# Patient Record
Sex: Female | Born: 1960 | Race: Black or African American | Hispanic: No | Marital: Married | Smoking: Former smoker
Health system: Southern US, Community
[De-identification: ages and names within clinical notes are randomized; demographics above are authoritative.]

## PROBLEM LIST (undated history)

## (undated) DIAGNOSIS — Z789 Other specified health status: Secondary | ICD-10-CM

## (undated) DIAGNOSIS — R112 Nausea with vomiting, unspecified: Secondary | ICD-10-CM

## (undated) DIAGNOSIS — Z9889 Other specified postprocedural states: Secondary | ICD-10-CM

## (undated) HISTORY — PX: ABDOMINAL HYSTERECTOMY: SHX81

## (undated) HISTORY — PX: SHOULDER OPEN ROTATOR CUFF REPAIR: SHX2407

## (undated) HISTORY — PX: EYE SURGERY: SHX253

---

## 1998-09-30 ENCOUNTER — Observation Stay (HOSPITAL_COMMUNITY): Admission: AD | Admit: 1998-09-30 | Discharge: 1998-10-01 | Payer: Self-pay | Admitting: Obstetrics and Gynecology

## 1998-09-30 ENCOUNTER — Encounter (INDEPENDENT_AMBULATORY_CARE_PROVIDER_SITE_OTHER): Payer: Self-pay | Admitting: Specialist

## 1999-05-10 ENCOUNTER — Other Ambulatory Visit: Admission: RE | Admit: 1999-05-10 | Discharge: 1999-05-10 | Payer: Self-pay | Admitting: Obstetrics and Gynecology

## 1999-11-29 ENCOUNTER — Inpatient Hospital Stay (HOSPITAL_COMMUNITY): Admission: AD | Admit: 1999-11-29 | Discharge: 1999-12-01 | Payer: Self-pay | Admitting: *Deleted

## 2000-01-06 ENCOUNTER — Other Ambulatory Visit: Admission: RE | Admit: 2000-01-06 | Discharge: 2000-01-06 | Payer: Self-pay | Admitting: Obstetrics and Gynecology

## 2000-01-24 ENCOUNTER — Encounter: Payer: Self-pay | Admitting: Obstetrics and Gynecology

## 2000-01-24 ENCOUNTER — Encounter: Admission: RE | Admit: 2000-01-24 | Discharge: 2000-01-24 | Payer: Self-pay | Admitting: Obstetrics and Gynecology

## 2000-11-01 ENCOUNTER — Encounter (INDEPENDENT_AMBULATORY_CARE_PROVIDER_SITE_OTHER): Payer: Self-pay | Admitting: Specialist

## 2000-11-01 ENCOUNTER — Observation Stay (HOSPITAL_COMMUNITY): Admission: RE | Admit: 2000-11-01 | Discharge: 2000-11-02 | Payer: Self-pay | Admitting: Orthopedic Surgery

## 2001-07-06 ENCOUNTER — Other Ambulatory Visit: Admission: RE | Admit: 2001-07-06 | Discharge: 2001-07-06 | Payer: Self-pay | Admitting: Obstetrics and Gynecology

## 2001-09-17 ENCOUNTER — Ambulatory Visit (HOSPITAL_COMMUNITY): Admission: RE | Admit: 2001-09-17 | Discharge: 2001-09-17 | Payer: Self-pay | Admitting: Obstetrics and Gynecology

## 2002-04-04 HISTORY — PX: CARPAL TUNNEL RELEASE: SHX101

## 2002-11-14 ENCOUNTER — Other Ambulatory Visit: Admission: RE | Admit: 2002-11-14 | Discharge: 2002-11-14 | Payer: Self-pay | Admitting: Obstetrics and Gynecology

## 2003-06-03 ENCOUNTER — Ambulatory Visit (HOSPITAL_COMMUNITY): Admission: RE | Admit: 2003-06-03 | Discharge: 2003-06-03 | Payer: Self-pay | Admitting: Obstetrics and Gynecology

## 2003-07-22 ENCOUNTER — Observation Stay (HOSPITAL_COMMUNITY): Admission: RE | Admit: 2003-07-22 | Discharge: 2003-07-23 | Payer: Self-pay | Admitting: Obstetrics and Gynecology

## 2003-07-22 ENCOUNTER — Encounter (INDEPENDENT_AMBULATORY_CARE_PROVIDER_SITE_OTHER): Payer: Self-pay | Admitting: Specialist

## 2004-02-19 ENCOUNTER — Emergency Department (HOSPITAL_COMMUNITY): Admission: EM | Admit: 2004-02-19 | Discharge: 2004-02-19 | Payer: Self-pay | Admitting: Emergency Medicine

## 2004-02-23 ENCOUNTER — Ambulatory Visit (HOSPITAL_COMMUNITY): Admission: RE | Admit: 2004-02-23 | Discharge: 2004-02-23 | Payer: Self-pay | Admitting: Family Medicine

## 2004-07-30 ENCOUNTER — Other Ambulatory Visit: Admission: RE | Admit: 2004-07-30 | Discharge: 2004-07-30 | Payer: Self-pay | Admitting: Obstetrics and Gynecology

## 2004-09-12 ENCOUNTER — Emergency Department (HOSPITAL_COMMUNITY): Admission: EM | Admit: 2004-09-12 | Discharge: 2004-09-12 | Payer: Self-pay | Admitting: *Deleted

## 2004-09-14 ENCOUNTER — Ambulatory Visit (HOSPITAL_COMMUNITY): Admission: RE | Admit: 2004-09-14 | Discharge: 2004-09-14 | Payer: Self-pay | Admitting: Family Medicine

## 2004-09-21 ENCOUNTER — Ambulatory Visit (HOSPITAL_COMMUNITY): Admission: RE | Admit: 2004-09-21 | Discharge: 2004-09-21 | Payer: Self-pay | Admitting: Family Medicine

## 2005-07-06 ENCOUNTER — Other Ambulatory Visit: Admission: RE | Admit: 2005-07-06 | Discharge: 2005-07-06 | Payer: Self-pay | Admitting: Family Medicine

## 2008-02-13 ENCOUNTER — Emergency Department (HOSPITAL_COMMUNITY): Admission: EM | Admit: 2008-02-13 | Discharge: 2008-02-13 | Payer: Self-pay | Admitting: Emergency Medicine
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
## 2010-08-20 NOTE — H&P (Signed)
[facility]  Patient:    [name], [name] Visit Number: [id number] MRN: [id number]          Service Type: DSU Location: [facility] Attending Physician:  Frederich Balding Dictated by:   Juluis Mire, M.D. Admit Date:  09/17/2001                           History and Physical  CHIEF COMPLAINT:              The patient is a 50 year old gravida 5 para 2 abortus 3 married black female who presents for permanent sterilization.  PRESENT ADMISSION:            The patient is desirous of permanent sterilization.  Alternative forms of birth control have been discussed; the potential irreversibility of sterilization explained.  A failure rate of 1 in 200 is quoted.  Failures can be in the form of ectopic pregnancy requiring further surgical management.  The patient understanding of the indications and risk and options and wishes to proceed with permanent sterilization.  ALLERGIES:                    No known drug allergies.  MEDICATIONS:                  None.  PAST MEDICAL HISTORY:         Usual childhood diseases without any significant sequelae.  PAST SURGICAL AND OBSTETRICAL HISTORY:          The patient had a previous laparoscopy in 1993 with finding of a chronic ectopic pregnancy.  Subsequently, did conceive and carry to a spontaneous vaginal delivery.  In the year 2000 had a subsequent recurrent ectopic pregnancy treated with laparoscopy and linear salpingostomy.  She recently had a spontaneous abortion earlier this year.  She has had two spontaneous vaginal deliveries in addition to those listed above, and one TAB.  FAMILY HISTORY:               Noncontributory.  SOCIAL HISTORY:               No tobacco or alcohol use.  REVIEW OF SYSTEMS:            Noncontributory.  PHYSICAL EXAMINATION:  VITAL SIGNS:                  The patient is afebrile with stable vital signs.  HEENT:                        The patient is normocephalic.  Pupils  equal, round, and reactive to light and accommodation.  Extraocular movements were intact.  Sclerae and conjunctivae were clear, oropharynx clear.  NECK:                         Without thyromegaly.  BREASTS:                      No discrete masses.  LUNGS:                        Clear.  CARDIOVASCULAR:               Regular rhythm and rate without murmurs or gallops.  ABDOMEN:  Benign.  No masses, organomegaly, or tenderness.  PELVIC:                       Normal external genitalia, vaginal mucosa is clear.  Cervix unremarkable.  Uterus normal size, shape, and contour.  Adnexa free of masses or tenderness.  EXTREMITIES:                  Trace edema.  NEUROLOGIC:                   Grossly within normal limits.  IMPRESSION:                   Multiparity, desires sterility.  PLAN:                         The patient to undergo a laparoscopic bilateral tubal ligation.  The risks of surgery have been discussed, including the risks of anesthesia; the risk of infection; the risk of hemorrhage that could necessitate transfusion with the risk of AIDS or hepatitis; the risk of injury to other organs including bladder, bowel, or ureters that could require further exploratory surgery; the risk of deep venous thrombosis and pulmonary emboli.  The patient professed an understanding of the indications and risks. Dictated by:   Juluis Mire, M.D. Attending Physician:  Frederich Balding DD:  09/17/01 TD:  09/17/01 Job: [id number]

## 2010-08-20 NOTE — Op Note (Signed)
NAMECAITLINN, [name]                          ACCOUNT NO.:  [id number]   MEDICAL RECORD NO.:  [id number]                   PATIENT TYPE:  OBV   LOCATION:  9309                                 FACILITY:  [facility]   PHYSICIAN:  Juluis Mire, M.D.                DATE OF BIRTH:  05/10/1960   DATE OF PROCEDURE:  07/23/2003  DATE OF DISCHARGE:  07/23/2003                                 OPERATIVE REPORT   PREOPERATIVE DIAGNOSES:  1. Uterine adenomyosis.  2. Pelvic adhesions.   POSTOPERATIVE DIAGNOSES:  1. Uterine adenomyosis.  2. Pelvic adhesions.   PROCEDURE:  Open laparoscopy.  Laparoscopically assisted vaginal  hysterectomy.   SURGEON:  Juluis Mire, M.D.   ASSISTANT:  Guy Sandifer. [name], M.D.   ANESTHESIA:  General endotracheal anesthesia.   ESTIMATED BLOOD LOSS:  300 mL.   PACKS AND DRAINS:  None.   INTRAOPERATIVE BLOOD PLACED:  None.   COMPLICATIONS:  None.   INDICATIONS FOR PROCEDURE:  As noted in the history and physical.   DESCRIPTION OF PROCEDURE:  The patient was taken to the OR and placed in the  supine position.  After satisfactory level of general endotracheal  anesthesia was obtained, the patient was placed in the dorsal lithotomy  position using the Allen stirrups.  The abdomen, perineum and vagina were  prepped out with Betadine.  Bladder was emptied by in and out  catheterization.  A Hulka tenaculum was put in place.  The patient draped  out for surgery.  A subumbilical incision was made with the knife.  Incision  was extended through subcutaneous tissue.  The fascia was identified and  entered sharply.  Incision in fascia extended laterally.  Peritoneum was  then entered.  There was no evidence of injury to adjacent organs.  Open  laparoscopic trocar was put into place and secured.  Laparoscope was then  introduced.  There was no evidence of injury to adjacent organs.  Visualization revealed some slight omental adhesions to the umbilical area.  No bowel  was noted.  The cecum was noted to be normal.  Appendix appeared to  be retrocecal.  Upper abdomen including the liver and tip of the gallbladder  were clear.  A 5 mm trocar was put into place in the suprapubic area under  direct visualization.  The uterus was markedly enlarged.  She had adhesions  on the right side from tubes and ovaries. Left adnexa was basically  unremarkable.  She had previous evidence of bilateral tubal ligation.  There  were some cul-de-sac adhesions also.  No active process such as  endometriosis were noted.  A third 5 mm trocar was put in place in the left  lower quadrant. Uterus was then elevated.  We first went to the right side.  We took down some adhesions involving the right ovary.  Also in the cul-de-  sac area.  At this point  in time, we were able to identify the adnexa  better.  We could easily see the ureter passed along the pelvic sidewall as  well out of the operative field.  We first identified the utero-ovarian  pedicle using the Gyrus bipolar, we cauterized and incised this along with  the peritoneum between the utero-ovarian pedicle and the round ligament.  Then the round ligament was cauterized and incised.  We further took down  some of the broad ligament.  We had good hemostasis.  We then went to the  left side where the left utero-ovarian pedicle was isolated, cauterized and  incised.  The peritoneum between the utero-ovarian pedicle and round  ligament was cauterized and incised and the left round ligament was  cauterized and incised.  The left broad ligament was taken down.  We then  developed a bladder flap anteriorly.  At this point in time, we had good  hemostasis.  The abdomen was deflated of its carbon dioxide and laparoscope  was removed.   The patient's legs were repositioned.  The Hulka tenaculum was then removed.  The weighted speculum was placed in the vaginal vault.  The cervix was  grasped with Christella Hartigan tenaculum.  Cul-de-sac was  entered sharply. Both  uterosacral ligaments were clamped, cut, and suture ligated with 0 Vicryl.  The reflection of the vaginal mucosa anteriorly was incised.  Paracervical  tissue was clamped, cut, and suture ligated with 0 Vicryl.  Bladder was  further dissected superiorly.  Uterine vessels were clamped, cut, and suture  ligated with 0 Vicryl.  Vesicouterine space was then entered, retractor was  put in place to retract the bladder superiorly using the clamp, cut and tie  with suture ligature layer of 0 Vicryl.  The parametrium was serially  separated from the sides of the uterus.  We got to the point where the  uterus would not come down anymore and yet not flip.  We therefore began  morcellating the uterus.  We first cut off the cervix and lower uterine  segment.  Then, part of the uterus proper was removed in sections.  At this  point in time, we were able to flip the uterus which we did successfully.  We identified the remaining pedicles, clamped and cut those and the  remaining part of the uterus removed from the operative field.  Head  pedicles were secured with three ties of 0 Vicryl.  Bleeding was countered  on both pelvic sidewalls at this point in time, identified, isolated and  brought under control with two figure-of-eights of 0 Vicryl.  We had good  hemostasis at this point.  Vaginal mucosa was reapproximated in a vertical  fashion with interrupted figure-of-eights with 0 Vicryl.  Foley was placed  to straight drain which revealed clear urine. Sponge on sponge stick was  placed in the vaginal vault.   The patient's legs were repositioned.  Abdomen was reinflated with carbon  dioxide.  Laparoscope was reintroduced.  Minimal bleeding was noted at the  top of the top of the vaginal cuff, brought under control using the  NovaSure.  We thoroughly irrigated the pelvis.  We had excellent hemostasis in both the ovaries and vaginal cuff.  The abdomen was deflated of its  carbon  dioxide.  All trocars removed.  Subumbilical fascia was closed with  two figure-of-eights of 0 Vicryl.  Skin was closed with interrupted  subcuticulars of 4-0 Vicryl.  Suprapubic incision was closed with Dermabond.  Sponge on sponge stick  was removed from the vaginal vault.  The patient was  taken out of the dorsal lithotomy position, once alert and extubated,  transferred to the recovery room in good condition.  Sponge, needle and  instrument counts were reported as correct by the circulating nurse x2.                                               Juluis Mire, M.D.    JSM/MEDQ  D:  07/23/2003  T:  07/24/2003  Job:  [id number]

## 2010-08-20 NOTE — Discharge Summary (Signed)
[name], [name]                          ACCOUNT NO.:  [id number]   MEDICAL RECORD NO.:  [id number]                   PATIENT TYPE:  OBV   LOCATION:  9309                                 FACILITY:  [facility]   PHYSICIAN:  Juluis Mire, M.D.                DATE OF BIRTH:  June 03, 1960   DATE OF ADMISSION:  07/22/2003  DATE OF DISCHARGE:  07/23/2003                                 DISCHARGE SUMMARY   ADMITTING DIAGNOSIS:  Uterine adenomyosis with pelvic adhesions.   DISCHARGE DIAGNOSIS:  Uterine adenomyosis with pelvic adhesions.   OPERATIVE PROCEDURE:  Laparoscopic-assisted vaginal hysterectomy.   For complete history and physical please see dictated note.   COURSE IN THE HOSPITAL:  The patient underwent the above-noted surgery,  pathology pending.  Postoperatively did well.  Postoperative hemoglobin 9.8.  Discharged home on postoperative day #1.  At that time she was afebrile with  stable vital signs.  Abdomen was soft and nontender, bowel sounds are  active, incision is clear, voiding okay, no active bleeding.   COMPLICATIONS:  None encountered during stay in the hospital.  The patient  discharged home in stable condition.   DISPOSITION:  Routine postoperative instruction are given.  She is to avoid  heavy lifting, vaginal entrance, or driving a car.  She is to watch for  signs of infection, nausea and vomiting, increasingly abdominal pain, or  active vaginal bleeding.  Discharged home on Tylox as needed for pain.  Plan  to follow up in the office in 1 week.                                               Juluis Mire, M.D.    JSM/MEDQ  D:  07/23/2003  T:  07/24/2003  Job:  [id number]

## 2010-08-20 NOTE — H&P (Signed)
NAMELASANDRA, [name]                          ACCOUNT NO.:  [id number]   MEDICAL RECORD NO.:  [id number]                   PATIENT TYPE:  OBV   LOCATION:  9309                                 FACILITY:  [facility]   PHYSICIAN:  Juluis Mire, M.D.                DATE OF BIRTH:  1960/11/21   DATE OF ADMISSION:  07/22/2003  DATE OF DISCHARGE:                                HISTORY & PHYSICAL   CHIEF COMPLAINT:  The patient is a 50 year old gravida 5 para 3 abortus 2  black female who presents for a laparoscopic-assisted vaginal hysterectomy.   PRESENT ADMISSION:  We have been following the patient with thought to be  felt to be uterine fibroids.  Cycles have remained relatively regular.  However, menstrual flow has become extremely heavy and limiting.  She is  also having increasing discomfort associated with her cycles.  We had felt  that the uterus had been enlarging.  We subsequently did obtain an  ultrasound which just revealed gross uterine enlargement, probably  consistent with adenomyosis.  Saline infusion ultrasound was otherwise  unremarkable.  She has had a previous bilateral tubal ligation.  We  attempted to control the cycles with birth control pills and were  unsuccessful.  We had discussed the options of endometrial ablation versus  hysterectomy and the patient is admitted for the latter procedure.  Again,  this is management for uterine enlargement thought to be secondary to  adenomyosis with increasing difficulty with her menstrual cycles.   ALLERGIES:  No known drug allergies.   MEDICATIONS:  Include vitamins and iron at the present time.   PAST MEDICAL HISTORY:  She has had usual childhood diseases with no  significant sequelae.   PAST SURGICAL HISTORY:  In 1993 she had what was thought to be a chronic  ectopic pregnancy.  This was laparoscopically managed and removed.  Subsequently, in 2000 she conceived and carried to a spontaneous vaginal  delivery.  In the year  2000 she had a recurrent ectopic pregnancy that was  treated laparoscopically with linear salpingostomy.  She has had one other  vaginal delivery since that time and has had one TAB.   FAMILY HISTORY:  Noncontributory.   SOCIAL HISTORY:  No tobacco or alcohol use.   REVIEW OF SYSTEMS:  Noncontributory.   PHYSICAL EXAMINATION:  GENERAL:  The patient is afebrile with stable vital  signs.  HEENT:  The patient is normocephalic.  Pupils equal, round, and reactive to  light and accommodation.  Extraocular movements were intact.  Sclerae and  conjunctivae were clear, oropharynx clear.  NECK:  Without thyromegaly.  BREASTS:  No discrete masses.  LUNGS:  Clear.  CARDIOVASCULAR:  Regular rhythm and rate without murmurs or gallops.  ABDOMEN:  Benign.  No masses, organomegaly, or tenderness.  Does have a  subumbilical incision.  PELVIC:  Normal external genitalia, vaginal mucosa is clear.  Cervix  unremarkable.  Uterus is grossly enlarged.  Mildly tender and boggy,  probable adenomyosis.  Adnexa unremarkable.  EXTREMITIES:  Trace edema.  NEUROLOGIC:  Grossly within normal limits.   IMPRESSION:  1. Probable uterine adenomyosis leading to increasing menorrhagia and     dysmenorrhea.  2. History of ectopic pregnancies.  3. Known pelvic adhesions.   PLAN:  The patient to undergo laparoscopic evaluation with attempt at  laparoscopic-assisted vaginal hysterectomy.  The risks of surgery have been  discussed including the risk of infection; the risk of hemorrhage that could  necessitate transfusion with the risk of AIDS or hepatitis; the risk of  injury to adjacent organs including bladder, bowel, or ureters that could  require further exploratory surgery; the risk of deep venous thrombosis and  pulmonary embolus.  The patient professed an understanding of the  indications and risks and is accepting of them.                                               Juluis Mire, M.D.    JSM/MEDQ  D:   07/22/2003  T:  07/22/2003  Job:  [id number]

## 2010-08-20 NOTE — Op Note (Signed)
[facility]  Patient:    [name], [name]                       MRN: [id number] Proc. Date: 11/01/00 Adm. Date:  [id number] Attending:  Skip Mayer                           Operative Report  PREOPERATIVE DIAGNOSES: 1. Lipoma, right shoulder. 2. Severe impingement syndrome, right shoulder. 3. Carpal tunnel syndrome, right wrist.  POSTOPERATIVE DIAGNOSES: 1. Lipoma, right shoulder. 2. Severe impingement syndrome, right shoulder. 3. Carpal tunnel syndrome, right wrist.  OPERATION: 1. Excision lipoma, right shoulder. 2. Open decompression of the right shoulder by carrying out a partial    acromionectomy and acromioplasty. 3. Excision of the subdeltoid bursa, right shoulder. 4. Exploration of the rotator cuff, right shoulder, which was intact. 5. Right carpal tunnel release.  SURGEON:  Georges Lynch. Gioffre, M.D.  ASSISTANT:  Alexzandrew L. Perkins, P.A.-C. (for shoulder surgery only)  DESCRIPTION OF PROCEDURE:  Under general anesthesia, routine orthopedic prepping and draping of the right shoulder was carried out.  Incision was made over the anterior aspect of the right shoulder.  She had 1 g of IV Ancef.  At this time, she had a large lipoma of the anterior aspect of the right shoulder, and I excised that and sent it to the lab as biopsy.  Following this, I went down and separated the deltoid tendon from the acromion.  I split the deltoid muscle proximally.  I went down and noted that she had an extremely large and thickened subdeltoid bursa.  She had severe impingement syndrome.  Her acromion was literally embedded down in the rotator cuff.  I first utilized the Ship broker to protect the rotator cuff.  I then did a partial excision of the acromion and then utilized the bur to even the undersurface out.  Following this, I excised the subdeltoid bursa, went down and explored the rotator cuff.  It was intact.  I thoroughly  irrigated out the area.  I went back and looked at the rotator cuff again, and it was intact.  We now had a nice clearance for her in regards to abduction of her shoulder after we did the acromionectomy.  I then applied some antibiotic-soaked Gelfoam up under the acromion for hemostasis purposes.  I then closed the wound in layers in usual fashion after I reapproximated the deltoid tendon and muscle.  Following this, a sterile dressing was applied.  We then redraped and reprepped for the right wrist.  For the above procedures, Avel Peace, P.A. was the assistant.  He did not assist on the carpal tunnel. I then reprepped the hand and wrist in the usual fashion.  I exsanguinated the extremity with Esmarch.  Tourniquet was elevated at 250 mmHg.  A lazy S incision was made over the right carpal tunnel region, bleeders identified and cauterized.  I went down and identified the palmaris longus and retracted it. I then went down and incised the superficial and deep transverse carpal ligaments and exposed the median nerve.  We now freed the nerve up proximally and distally.  I thoroughly irrigated the area and closed the skin only with 3-0 nylon suture.  A sterile Neosporin bundle dressing applied.  The patient left the operating room in satisfactory condition. DD:  11/01/00 TD:  11/01/00 Job: [id number]

## 2010-11-22 ENCOUNTER — Other Ambulatory Visit (HOSPITAL_COMMUNITY)
Admission: RE | Admit: 2010-11-22 | Discharge: 2010-11-22 | Disposition: A | Discharge status: Home / Self Care | Payer: BC Managed Care – PPO | Source: Ambulatory Visit | Attending: Family Medicine | Admitting: Family Medicine

## 2010-11-22 ENCOUNTER — Other Ambulatory Visit: Payer: Self-pay | Admitting: Family Medicine

## 2010-11-22 DIAGNOSIS — Z0000 Encounter for general adult medical examination without abnormal findings: Secondary | ICD-10-CM | POA: Insufficient documentation

## 2011-05-30 ENCOUNTER — Other Ambulatory Visit: Payer: Self-pay | Admitting: Orthopedic Surgery

## 2011-05-30 DIAGNOSIS — M542 Cervicalgia: Secondary | ICD-10-CM

## 2011-06-03 ENCOUNTER — Ambulatory Visit
Admission: RE | Admit: 2011-06-03 | Discharge: 2011-06-03 | Disposition: A | Discharge status: Home / Self Care | Payer: BC Managed Care – PPO | Source: Ambulatory Visit | Attending: Orthopedic Surgery | Admitting: Orthopedic Surgery

## 2011-06-03 DIAGNOSIS — M542 Cervicalgia: Secondary | ICD-10-CM

## 2011-07-25 ENCOUNTER — Encounter (HOSPITAL_COMMUNITY): Payer: Self-pay | Admitting: Respiratory Therapy

## 2011-07-25 ENCOUNTER — Other Ambulatory Visit: Payer: Self-pay | Admitting: Orthopedic Surgery

## 2011-07-26 ENCOUNTER — Other Ambulatory Visit: Payer: Self-pay | Admitting: Orthopedic Surgery

## 2011-07-29 ENCOUNTER — Encounter (HOSPITAL_COMMUNITY)
Admission: RE | Admit: 2011-07-29 | Discharge: 2011-07-29 | Disposition: A | Discharge status: Home / Self Care | Payer: BC Managed Care – PPO | Source: Ambulatory Visit | Attending: Orthopedic Surgery | Admitting: Orthopedic Surgery

## 2011-07-29 ENCOUNTER — Other Ambulatory Visit (HOSPITAL_COMMUNITY): Payer: BC Managed Care – PPO

## 2011-07-29 ENCOUNTER — Encounter (HOSPITAL_COMMUNITY): Payer: Self-pay

## 2011-07-29 HISTORY — DX: Other specified health status: Z78.9

## 2011-07-29 HISTORY — DX: Other specified postprocedural states: Z98.890

## 2011-07-29 HISTORY — DX: Nausea with vomiting, unspecified: R11.2

## 2011-07-29 LAB — CBC
HCT: 37.8 % (ref 36.0–46.0)
MCV: 82.5 fL (ref 78.0–100.0)
RBC: 4.58 MIL/uL (ref 3.87–5.11)
WBC: 3.6 10*3/uL — ABNORMAL LOW (ref 4.0–10.5)

## 2011-07-29 LAB — DIFFERENTIAL
Basophils Absolute: 0 10*3/uL (ref 0.0–0.1)
Basophils Relative: 1 % (ref 0–1)
Monocytes Absolute: 0.2 10*3/uL (ref 0.1–1.0)
Neutro Abs: 1.5 10*3/uL — ABNORMAL LOW (ref 1.7–7.7)
Neutrophils Relative %: 40 % — ABNORMAL LOW (ref 43–77)

## 2011-07-29 LAB — PROTIME-INR: INR: 1.01 (ref 0.00–1.49)

## 2011-07-29 LAB — URINALYSIS, ROUTINE W REFLEX MICROSCOPIC
Glucose, UA: NEGATIVE mg/dL
Hgb urine dipstick: NEGATIVE
Protein, ur: NEGATIVE mg/dL
pH: 6.5 (ref 5.0–8.0)

## 2011-07-29 LAB — ABO/RH: ABO/RH(D): A POS

## 2011-07-29 LAB — COMPREHENSIVE METABOLIC PANEL
BUN: 14 mg/dL (ref 6–23)
CO2: 29 mEq/L (ref 19–32)
Chloride: 106 mEq/L (ref 96–112)
Creatinine, Ser: 0.92 mg/dL (ref 0.50–1.10)
GFR calc non Af Amer: 70 mL/min — ABNORMAL LOW (ref >90)
Total Bilirubin: 0.5 mg/dL (ref 0.3–1.2)

## 2011-07-29 LAB — TYPE AND SCREEN: ABO/RH(D): A POS

## 2011-07-29 LAB — SURGICAL PCR SCREEN: MRSA, PCR: NEGATIVE

## 2011-07-29 NOTE — Pre-Procedure Instructions (Signed)
20 [name]  07/29/2011   Your procedure is scheduled on:  Wednesday, May 1  Report to [facility] at 0630 AM.  Call this number if you have problems the morning of surgery: [phone]   Remember:   Do not eat food:After Midnight.  May have clear liquids: up to 4 Hours before arrival.(2:30 am)  Clear liquids include soda, tea, black coffee, apple or grape juice, broth.  Take these medicines the morning of surgery with A SIP OF WATER: *Tramadol if needed**   Do not wear jewelry, make-up or nail polish.  Do not wear lotions, powders, or perfumes. You may wear deodorant.  Do not shave 48 hours prior to surgery.  Do not bring valuables to the hospital.  Contacts, dentures or bridgework may not be worn into surgery.  Leave suitcase in the car. After surgery it may be brought to your room.  For patients admitted to the hospital, checkout time is 11:00 AM the day of discharge.   Patients discharged the day of surgery will not be allowed to drive home.  Name and phone number of your driver: *n/a**  Special Instructions: CHG Shower Use Special Wash: 1/2 bottle night before surgery and 1/2 bottle morning of surgery.   Please read over the following fact sheets that you were given: Pain Booklet, Coughing and Deep Breathing, MRSA Information and Surgical Site Infection Prevention

## 2011-08-02 MED ORDER — CEFAZOLIN SODIUM-DEXTROSE 2-3 GM-% IV SOLR
2.0000 g | INTRAVENOUS | Status: AC
  Administered 2011-08-03: 2 g via INTRAVENOUS
  Filled 2011-08-02: qty 50

## 2011-08-03 ENCOUNTER — Inpatient Hospital Stay (HOSPITAL_COMMUNITY): Payer: BC Managed Care – PPO

## 2011-08-03 ENCOUNTER — Encounter (HOSPITAL_COMMUNITY): Payer: Self-pay

## 2011-08-03 ENCOUNTER — Encounter (HOSPITAL_COMMUNITY): Payer: Self-pay | Admitting: Anesthesiology

## 2011-08-03 ENCOUNTER — Ambulatory Visit (HOSPITAL_COMMUNITY): Payer: BC Managed Care – PPO | Admitting: Anesthesiology

## 2011-08-03 ENCOUNTER — Encounter (HOSPITAL_COMMUNITY)
Admission: RE | Disposition: A | Discharge status: Home / Self Care | Payer: Self-pay | Source: Ambulatory Visit | Attending: Orthopedic Surgery

## 2011-08-03 ENCOUNTER — Inpatient Hospital Stay (HOSPITAL_COMMUNITY)
Admission: RE | Admit: 2011-08-03 | Discharge: 2011-08-04 | DRG: 865 | Disposition: A | Discharge status: Home / Self Care | Payer: BC Managed Care – PPO | Source: Ambulatory Visit | Attending: Orthopedic Surgery | Admitting: Orthopedic Surgery

## 2011-08-03 DIAGNOSIS — M51379 Other intervertebral disc degeneration, lumbosacral region without mention of lumbar back pain or lower extremity pain: Principal | ICD-10-CM | POA: Diagnosis present

## 2011-08-03 DIAGNOSIS — Z01818 Encounter for other preprocedural examination: Secondary | ICD-10-CM

## 2011-08-03 DIAGNOSIS — M5137 Other intervertebral disc degeneration, lumbosacral region: Principal | ICD-10-CM | POA: Diagnosis present

## 2011-08-03 DIAGNOSIS — M5412 Radiculopathy, cervical region: Secondary | ICD-10-CM

## 2011-08-03 DIAGNOSIS — Z01812 Encounter for preprocedural laboratory examination: Secondary | ICD-10-CM

## 2011-08-03 HISTORY — PX: ANTERIOR CERVICAL DECOMP/DISCECTOMY FUSION: SHX1161

## 2011-08-03 SURGERY — ANTERIOR CERVICAL DECOMPRESSION/DISCECTOMY FUSION 1 LEVEL
Anesthesia: General | Site: Spine Cervical | Laterality: Right | Wound class: Clean

## 2011-08-03 MED ORDER — PROPOFOL 10 MG/ML IV EMUL
INTRAVENOUS | Status: DC | PRN
  Administered 2011-08-03: 30 mg via INTRAVENOUS
  Administered 2011-08-03: 120 mg via INTRAVENOUS

## 2011-08-03 MED ORDER — OXYCODONE-ACETAMINOPHEN 5-325 MG PO TABS
1.0000 | ORAL_TABLET | ORAL | Status: DC | PRN
  Administered 2011-08-03 – 2011-08-04 (×3): 2 via ORAL
  Filled 2011-08-03 (×3): qty 2

## 2011-08-03 MED ORDER — MENTHOL 3 MG MT LOZG
1.0000 | LOZENGE | OROMUCOSAL | Status: DC | PRN
  Filled 2011-08-03: qty 9

## 2011-08-03 MED ORDER — ONDANSETRON HCL 4 MG/2ML IJ SOLN
INTRAMUSCULAR | Status: DC | PRN
  Administered 2011-08-03: 4 mg via INTRAVENOUS

## 2011-08-03 MED ORDER — PHENYLEPHRINE HCL 10 MG/ML IJ SOLN
10.0000 mg | INTRAVENOUS | Status: DC | PRN
  Administered 2011-08-03: 20 ug/min via INTRAVENOUS

## 2011-08-03 MED ORDER — CEFAZOLIN SODIUM 1-5 GM-% IV SOLN
1.0000 g | Freq: Three times a day (TID) | INTRAVENOUS | Status: AC
  Administered 2011-08-03 (×2): 1 g via INTRAVENOUS
  Filled 2011-08-03 (×2): qty 50

## 2011-08-03 MED ORDER — OXYCODONE-ACETAMINOPHEN 5-325 MG PO TABS
ORAL_TABLET | ORAL | Status: AC
  Administered 2011-08-03: 2
  Filled 2011-08-03: qty 2

## 2011-08-03 MED ORDER — POTASSIUM CHLORIDE IN NACL 20-0.9 MEQ/L-% IV SOLN
INTRAVENOUS | Status: DC
  Filled 2011-08-03 (×4): qty 1000

## 2011-08-03 MED ORDER — PHENOL 1.4 % MT LIQD: 1.0000 | OROMUCOSAL | Status: DC | PRN

## 2011-08-03 MED ORDER — SODIUM CHLORIDE 0.9 % IJ SOLN
3.0000 mL | Freq: Two times a day (BID) | INTRAMUSCULAR | Status: DC
  Administered 2011-08-03 (×2): 3 mL via INTRAVENOUS

## 2011-08-03 MED ORDER — POVIDONE-IODINE 7.5 % EX SOLN
Freq: Once | CUTANEOUS | Status: DC
  Filled 2011-08-03: qty 118

## 2011-08-03 MED ORDER — LACTATED RINGERS IV SOLN
INTRAVENOUS | Status: DC | PRN
  Administered 2011-08-03 (×2): via INTRAVENOUS

## 2011-08-03 MED ORDER — LIDOCAINE HCL (CARDIAC) 20 MG/ML IV SOLN
INTRAVENOUS | Status: DC | PRN
  Administered 2011-08-03: 100 mg via INTRAVENOUS

## 2011-08-03 MED ORDER — ACETAMINOPHEN 325 MG PO TABS: 650.0000 mg | ORAL_TABLET | ORAL | Status: DC | PRN

## 2011-08-03 MED ORDER — DOCUSATE SODIUM 100 MG PO CAPS
100.0000 mg | ORAL_CAPSULE | Freq: Two times a day (BID) | ORAL | Status: DC
  Administered 2011-08-03: 100 mg via ORAL
  Filled 2011-08-03: qty 1

## 2011-08-03 MED ORDER — ZOLPIDEM TARTRATE 5 MG PO TABS: 5.0000 mg | ORAL_TABLET | Freq: Every evening | ORAL | Status: DC | PRN

## 2011-08-03 MED ORDER — 0.9 % SODIUM CHLORIDE (POUR BTL) OPTIME
TOPICAL | Status: DC | PRN
  Administered 2011-08-03: 1000 mL

## 2011-08-03 MED ORDER — FENTANYL CITRATE 0.05 MG/ML IJ SOLN
INTRAMUSCULAR | Status: DC | PRN
  Administered 2011-08-03: 50 ug via INTRAVENOUS
  Administered 2011-08-03: 100 ug via INTRAVENOUS
  Administered 2011-08-03 (×2): 50 ug via INTRAVENOUS

## 2011-08-03 MED ORDER — ACETAMINOPHEN 650 MG RE SUPP: 650.0000 mg | RECTAL | Status: DC | PRN

## 2011-08-03 MED ORDER — ROCURONIUM BROMIDE 100 MG/10ML IV SOLN
INTRAVENOUS | Status: DC | PRN
  Administered 2011-08-03 (×2): 10 mg via INTRAVENOUS
  Administered 2011-08-03: 50 mg via INTRAVENOUS
  Administered 2011-08-03: 10 mg via INTRAVENOUS

## 2011-08-03 MED ORDER — NEOSTIGMINE METHYLSULFATE 1 MG/ML IJ SOLN
INTRAMUSCULAR | Status: DC | PRN
  Administered 2011-08-03: 3 mg via INTRAVENOUS
  Administered 2011-08-03: 2 mg via INTRAVENOUS

## 2011-08-03 MED ORDER — MORPHINE SULFATE 4 MG/ML IJ SOLN: 2.0000 mg | INTRAMUSCULAR | Status: DC | PRN

## 2011-08-03 MED ORDER — ADULT MULTIVITAMIN W/MINERALS CH
1.0000 | ORAL_TABLET | Freq: Every day | ORAL | Status: DC
  Administered 2011-08-03: 1 via ORAL
  Filled 2011-08-03 (×2): qty 1

## 2011-08-03 MED ORDER — ALUM & MAG HYDROXIDE-SIMETH 200-200-20 MG/5ML PO SUSP: 30.0000 mL | Freq: Four times a day (QID) | ORAL | Status: DC | PRN

## 2011-08-03 MED ORDER — DIAZEPAM 5 MG PO TABS
5.0000 mg | ORAL_TABLET | Freq: Four times a day (QID) | ORAL | Status: DC | PRN
  Administered 2011-08-03 (×2): 5 mg via ORAL
  Filled 2011-08-03 (×2): qty 1

## 2011-08-03 MED ORDER — ONDANSETRON HCL 4 MG/2ML IJ SOLN: 4.0000 mg | Freq: Once | INTRAMUSCULAR | Status: DC | PRN

## 2011-08-03 MED ORDER — BUPIVACAINE-EPINEPHRINE 0.25% -1:200000 IJ SOLN
INTRAMUSCULAR | Status: DC | PRN
  Administered 2011-08-03: 3 mL

## 2011-08-03 MED ORDER — MIDAZOLAM HCL 5 MG/5ML IJ SOLN
INTRAMUSCULAR | Status: DC | PRN
  Administered 2011-08-03 (×2): 1 mg via INTRAVENOUS

## 2011-08-03 MED ORDER — EPHEDRINE SULFATE 50 MG/ML IJ SOLN
INTRAMUSCULAR | Status: DC | PRN
  Administered 2011-08-03: 5 mg via INTRAVENOUS

## 2011-08-03 MED ORDER — GLYCOPYRROLATE 0.2 MG/ML IJ SOLN
INTRAMUSCULAR | Status: DC | PRN
  Administered 2011-08-03: 0.4 mg via INTRAVENOUS
  Administered 2011-08-03: 0.3 mg via INTRAVENOUS

## 2011-08-03 MED ORDER — SODIUM CHLORIDE 0.9 % IV SOLN: 250.0000 mL | INTRAVENOUS | Status: DC

## 2011-08-03 MED ORDER — PHENYLEPHRINE HCL 10 MG/ML IJ SOLN
INTRAMUSCULAR | Status: DC | PRN
  Administered 2011-08-03 (×2): 40 ug via INTRAVENOUS

## 2011-08-03 MED ORDER — SENNA 8.6 MG PO TABS
1.0000 | ORAL_TABLET | Freq: Two times a day (BID) | ORAL | Status: DC
  Filled 2011-08-03 (×2): qty 1

## 2011-08-03 MED ORDER — THROMBIN 20000 UNITS EX KIT
PACK | OROMUCOSAL | Status: DC | PRN
  Administered 2011-08-03: 09:00:00 via TOPICAL

## 2011-08-03 MED ORDER — MORPHINE SULFATE 2 MG/ML IJ SOLN: 0.0500 mg/kg | INTRAMUSCULAR | Status: DC | PRN

## 2011-08-03 MED ORDER — SODIUM CHLORIDE 0.9 % IJ SOLN: 3.0000 mL | INTRAMUSCULAR | Status: DC | PRN

## 2011-08-03 MED ORDER — HYDROMORPHONE HCL PF 1 MG/ML IJ SOLN
0.2500 mg | INTRAMUSCULAR | Status: DC | PRN
  Administered 2011-08-03 (×2): 0.5 mg via INTRAVENOUS

## 2011-08-03 MED ORDER — ONDANSETRON HCL 4 MG/2ML IJ SOLN: 4.0000 mg | INTRAMUSCULAR | Status: DC | PRN

## 2011-08-03 SURGICAL SUPPLY — 73 items
APL SKNCLS STERI-STRIP NONHPOA (GAUZE/BANDAGES/DRESSINGS) ×1
BENZOIN TINCTURE PRP APPL 2/3 (GAUZE/BANDAGES/DRESSINGS) ×2 IMPLANT
BIT DRILL NEURO 2X3.1 SFT TUCH (MISCELLANEOUS) ×1 IMPLANT
BLADE SURG 15 STRL LF DISP TIS (BLADE) IMPLANT
BLADE SURG 15 STRL SS (BLADE)
BLADE SURG ROTATE 9660 (MISCELLANEOUS) ×2 IMPLANT
BUR MATCHSTICK NEURO 3.0 LAGG (BURR) ×2 IMPLANT
CARTRIDGE OIL MAESTRO DRILL (MISCELLANEOUS) ×1 IMPLANT
CLOTH BEACON ORANGE TIMEOUT ST (SAFETY) ×2 IMPLANT
COLLAR CERV LO CONTOUR FIRM DE (SOFTGOODS) IMPLANT
CONT SPEC 4OZ CLIKSEAL STRL BL (MISCELLANEOUS) ×2 IMPLANT
CORDS BIPOLAR (ELECTRODE) ×2 IMPLANT
COVER SURGICAL LIGHT HANDLE (MISCELLANEOUS) ×2 IMPLANT
CRADLE DONUT ADULT HEAD (MISCELLANEOUS) ×2 IMPLANT
DEVICE ENDSKLTN MED 6 7MM (Orthopedic Implant) ×2 IMPLANT
DIFFUSER DRILL AIR PNEUMATIC (MISCELLANEOUS) ×2 IMPLANT
DRAIN JACKSON RD 7FR 3/32 (WOUND CARE) IMPLANT
DRAPE C-ARM 42X72 X-RAY (DRAPES) ×2 IMPLANT
DRAPE POUCH INSTRU U-SHP 10X18 (DRAPES) ×2 IMPLANT
DRAPE SURG 17X23 STRL (DRAPES) ×6 IMPLANT
DRILL NEURO 2X3.1 SOFT TOUCH (MISCELLANEOUS) ×2
DURAPREP 26ML APPLICATOR (WOUND CARE) ×2 IMPLANT
ELECT COATED BLADE 2.86 ST (ELECTRODE) ×2 IMPLANT
ELECT REM PT RETURN 9FT ADLT (ELECTROSURGICAL) ×2
ELECTRODE REM PT RTRN 9FT ADLT (ELECTROSURGICAL) ×1 IMPLANT
ENDOSKELETON MED 6 7MM (Orthopedic Implant) ×4 IMPLANT
EVACUATOR SILICONE 100CC (DRAIN) IMPLANT
GAUZE SPONGE 4X4 16PLY XRAY LF (GAUZE/BANDAGES/DRESSINGS) ×2 IMPLANT
GLOVE BIO SURGEON STRL SZ8 (GLOVE) ×6 IMPLANT
GLOVE BIOGEL PI IND STRL 8 (GLOVE) ×3 IMPLANT
GLOVE BIOGEL PI INDICATOR 8 (GLOVE) ×3
GLOVE BIOGEL PI ORTHO PRO SZ7 (GLOVE) ×1
GLOVE PI ORTHO PRO STRL SZ7 (GLOVE) ×1 IMPLANT
GLOVE SURG SS PI 7.0 STRL IVOR (GLOVE) ×2 IMPLANT
GLOVE SURG SS PI 7.5 STRL IVOR (GLOVE) ×6 IMPLANT
GOWN SRG XL XLNG 56XLVL 4 (GOWN DISPOSABLE) ×4 IMPLANT
GOWN STRL NON-REIN LRG LVL3 (GOWN DISPOSABLE) IMPLANT
GOWN STRL NON-REIN XL XLG LVL4 (GOWN DISPOSABLE) ×8
IV CATH 14GX2 1/4 (CATHETERS) ×2 IMPLANT
KIT BASIN OR (CUSTOM PROCEDURE TRAY) ×2 IMPLANT
KIT ROOM TURNOVER OR (KITS) ×2 IMPLANT
MANIFOLD NEPTUNE II (INSTRUMENTS) ×2 IMPLANT
NEEDLE 27GAX1X1/2 (NEEDLE) ×2 IMPLANT
NEEDLE SPNL 20GX3.5 QUINCKE YW (NEEDLE) ×2 IMPLANT
NS IRRIG 1000ML POUR BTL (IV SOLUTION) ×2 IMPLANT
OIL CARTRIDGE MAESTRO DRILL (MISCELLANEOUS) ×2
PACK ORTHO CERVICAL (CUSTOM PROCEDURE TRAY) ×2 IMPLANT
PAD ARMBOARD 7.5X6 YLW CONV (MISCELLANEOUS) ×4 IMPLANT
PATTIES SURGICAL .5 X.5 (GAUZE/BANDAGES/DRESSINGS) IMPLANT
PATTIES SURGICAL .5 X1 (DISPOSABLE) ×2 IMPLANT
PIN DISTRACTOR STERILE 14MM (PIN) ×4 IMPLANT
PLATE VECTRA (Plate) ×2 IMPLANT
PUTTY BONE DBX 2.5 MIS (Bone Implant) ×2 IMPLANT
SCREW 4.0X16MM (Screw) ×12 IMPLANT
SPONGE GAUZE 4X4 12PLY (GAUZE/BANDAGES/DRESSINGS) ×2 IMPLANT
SPONGE INTESTINAL PEANUT (DISPOSABLE) ×4 IMPLANT
SPONGE SURGIFOAM ABS GEL 100 (HEMOSTASIS) ×2 IMPLANT
STRIP CLOSURE SKIN 1/2X4 (GAUZE/BANDAGES/DRESSINGS) ×2 IMPLANT
SURGIFLO TRUKIT (HEMOSTASIS) IMPLANT
SUT MNCRL AB 4-0 PS2 18 (SUTURE) ×2 IMPLANT
SUT SILK 4 0 (SUTURE)
SUT SILK 4-0 18XBRD TIE 12 (SUTURE) IMPLANT
SUT VIC AB 1 CT1 27 (SUTURE)
SUT VIC AB 1 CT1 27XBRD ANBCTR (SUTURE) IMPLANT
SUT VIC AB 2-0 CT2 18 VCP726D (SUTURE) ×2 IMPLANT
SYR BULB IRRIGATION 50ML (SYRINGE) ×2 IMPLANT
SYR CONTROL 10ML LL (SYRINGE) ×4 IMPLANT
TAPE CLOTH 4X10 WHT NS (GAUZE/BANDAGES/DRESSINGS) ×2 IMPLANT
TAPE UMBILICAL COTTON 1/8X30 (MISCELLANEOUS) ×2 IMPLANT
TOWEL OR 17X24 6PK STRL BLUE (TOWEL DISPOSABLE) ×2 IMPLANT
TOWEL OR 17X26 10 PK STRL BLUE (TOWEL DISPOSABLE) ×2 IMPLANT
WATER STERILE IRR 1000ML POUR (IV SOLUTION) ×2 IMPLANT
YANKAUER SUCT BULB TIP NO VENT (SUCTIONS) ×2 IMPLANT

## 2011-08-03 NOTE — Progress Notes (Signed)
Orthopedic Tech Progress Note Patient Details:  [name] [birth year] [id number]  Other Ortho Devices Type of Ortho Device: [place] cervical collar Ortho Device Interventions: Application   Cammer, Mickie Bail 08/03/2011, 1:21 PM

## 2011-08-03 NOTE — Op Note (Signed)
NAMENICHOLAS, OSSA NO.:  [id number]  MEDICAL RECORD NO.:  [id number]  LOCATION:  3526                         FACILITY:  [facility]  PHYSICIAN:  Estill Bamberg, MD      DATE OF BIRTH:  1960-09-19  DATE OF PROCEDURE:  08/03/2011 DATE OF DISCHARGE:                              OPERATIVE REPORT   PREOPERATIVE DIAGNOSES: 1. Right-sided cervical radiculopathy. 2. C5-6, C6-7 degenerative disk disease.  POSTOPERATIVE DIAGNOSES: 1. Right-sided cervical radiculopathy. 2. C5-6, C6-7 degenerative disk     disease.  PROCEDURES: 1. Anterior cervical decompression and fusion C5-6, C6-7. 2. Placement of anterior instrumentation, C5, C6, C7. 3. Insertion of interbody device x2 (7 mm lordotic Titan interbody     cage). 4. Use of local autograft. 5. Use of morselized allograft (DBX mix). 6. Intraoperative use of fluoroscopy.  SURGEONS:  Estill Bamberg, MD.  ASSISTANT:  Janace Litten, OPA  ANESTHESIA:  General endotracheal anesthesia.  COMPLICATIONS:  None.  DISPOSITION:  Stable.  ESTIMATED BLOOD LOSS:  Minimal.  INDICATIONS FOR PROCEDURE:  Briefly, Ms. Kobler is an extremely pleasant 51 year old female, who initially presented to me on May 30, 2011, with a 43-month history of severe pain in her neck and in her right arm. The patient did state that her pain was severe.  She did have conservative care but continued to have pain.  I did review radiographs which was notable for moderate to severe degenerative disk disease associated with the C5-6 and C6-7 levels.  An MRI was also consistent with neural foraminal stenosis on the right side at C6-7 and degenerative disk disease at C5-6 with a disk herniation which was contacting the ventral aspect of the spinal cord.  Given the patient's failure of conservative care, we did have a discussion regarding going forward the C5-6 and C6-7 anterior cervical decompression and fusion. The patient fully understood the risks and  limitations of the procedure as outlined in my preoperative note.  OPERATIVE DETAILS:  On Aug 03, 2011, the patient was brought to surgery and general endotracheal anesthesia was administered.  The patient was placed supine on a well-padded hospital bed.  The neck was placed in a gentle degree of extension.  The arms were secured to the patient's sides and the ulnar nerves were padded appropriately, as were all bony prominences.  The shoulders were taped to the inferior aspect of the bed.  SCDs were placed, and antibiotics were given.  Time-out procedure was performed.  The neck was then prepped and draped in usual sterile fashion.  I then made a left-sided transverse incision from the midline to the medial border of the sternocleidomastoid muscle.  The platysma was sharply incised in the plane between the sternocleidomastoid muscle laterally and strap muscles medially, it was readily identified and explored.  The anterior cervical spine was readily noted.  I then placed a spinal needle into the C5-6 interspace and a lateral fluoroscopic view did confirm this to be the appropriate operative level.  I then subperiosteally exposed the vertebral bodies of C5,C6, and C7 and a self- retaining Shadow Line retractor was placed, centered over the C6-7 interspace.  I then used a rongeur to remove osteophytes  anteriorly at both the C5-6 and C6-7 levels,  and the bone spurs were placed on the back table for later use.  I then used a 15 blade knife to perform an annulotomy anteriorly, and I did place Caspar pins into the C6 and C7 vertebral bodies.  Gentle distraction was applied across the Caspar pins.  I then went forward with a standard diskectomy in the usual fashion.  The posterior longitudinal ligament was readily identified and was entered using a nerve hook.  I then used #1 followed by #2 Kerrison to remove the posterior annulus and the posterior longitudinal ligament, and I did perform a  thorough neural foraminal decompression on the right side.  I was easily able to pass a nerve hook out the right neural foramen at the termination of this aspect of the procedure.  I then gently prepared the endplates using a series of rasps.  I then placed a series of trials and did feel that there is 7 mm lordotic interbody trial would be the most appropriate fit.  The implant was then packed with autograft to obtain for removing the osteophytes and with allograft in the form of the DBX.  Next, this was obtained from position in the usual fashion.  I then removed the C7 Caspar pin and bone wax was placed in its place.  I then placed a Caspar pin into the C5 vertebral body.  Diskectomy was performed in the manner described previously.  I again performed a thorough neural foraminal decompression on both the right and the left sides, and I was able to confirm adequate decompression of both the spinal canal and the neural foramen on both the right and the left sides.  I then again prepared the endplates in manner described previously.  I then again placed a series of trials.  I did feel that a 7 mm lordotic interbody trial would be the most appropriate fit.  This was again packed with autograft and allograft and tamped into position in the manner described previously.  Of note, I did use lateral fluoroscopy while placing the implants to ensure adequate position of each of the interbody implants.  I then removed the Caspar pins from the C5 and C6 vertebral bodies, bone wax was placed into place.  I then evaluated the interbody implants and I did note an excellent press fit of each of the implants with no motion identified.  I then chose a 30 mm Synthes Vectra plate.  This was placed over the anterior cervical spine. I did use a 2 mm bur to decorticate the entry point for the vertebral body screws.  There were 2 vertebral body screws placed in each vertebra associated with C5, C6, and C7.  I  did note an excellent press fit of each of the screws.  Of note, 16 mm self-drilling, self-tapping screws were placed.  I then copiously irrigated the wound and I again obtained a lateral and oblique radiographs to confirm appropriate positioning of the anterior plate and the interbody implants.  I then closed the platysma using 2-0 Vicryl, and the skin was closed using 4-0 Monocryl.  All instrument counts were correct at the termination of the procedure.  Of note, Janace Litten, was my assistant throughout the procedure and aided in essential retraction and suctioning required throughout the surgery.     Estill Bamberg, MD     MD/MEDQ  D:  08/03/2011  T:  08/03/2011  Job:  [id number]  cc:   Dario Guardian,  M.D. Genene Churn

## 2011-08-03 NOTE — H&P (Signed)
PREOPERATIVE H&P  Chief Complaint: Right arm pain  HPI: [name] is a 51 y.o. female who presents with right arm pain  Past Medical History  Diagnosis Date  . No pertinent past medical history   . PONV (postoperative nausea and vomiting)     pt believes due to morphine   Past Surgical History  Procedure Date  . Abdominal hysterectomy   . Shoulder open rotator cuff repair 2004, 2006    right shoulder  x2  . Carpal tunnel release 2004    right hand  . Eye surgery     lasik   History   Social History  . Marital Status: Single    Spouse Name: N/A    Number of Children: N/A  . Years of Education: N/A   Social History Main Topics  . Smoking status: Former Smoker -- 0.2 packs/day for 10 years    Types: Cigarettes  . Smokeless tobacco: Never Used   Comment: quit smoking 25 yrs   ago  . Alcohol Use: No  . Drug Use: No  . Sexually Active: Yes    Birth Control/ Protection: Surgical   Other Topics Concern  . None   Social History Narrative  . None   Family History  Problem Relation Age of Onset  . Anesthesia problems Neg Hx    Allergies  Allergen Reactions  . Morphine And Related Shortness Of Breath    Non responsive; admitted to ICU   Prior to Admission medications   Medication Sig Start Date End Date Taking? Authorizing Provider  Ascorbic Acid (VITAMIN C PO) Take 1 tablet by mouth daily.   Yes Historical Provider, MD  CALCIUM-MAGNESIUM-ZINC PO Take 1 tablet by mouth daily.   Yes Historical Provider, MD  Cyanocobalamin (VITAMIN B-12) 1000 MCG SUBL Place 1,000 mcg under the tongue daily.   Yes Historical Provider, MD  ibuprofen (ADVIL,MOTRIN) 200 MG tablet Take 400 mg by mouth every 6 (six) hours as needed. For pain   Yes Historical Provider, MD  Multiple Vitamin (MULITIVITAMIN WITH MINERALS) TABS Take 1 tablet by mouth daily.   Yes Historical Provider, MD  OVER THE COUNTER MEDICATION Take 1 tablet by mouth daily. Bone and Joint supplement   Yes Historical  Provider, MD  traMADol (ULTRAM) 50 MG tablet Take 50 mg by mouth every 6 (six) hours as needed.   Yes Historical Provider, MD     All other systems have been reviewed and were otherwise negative with the exception of those mentioned in the HPI and as above.  Physical Exam: Filed Vitals:   08/03/11 0707  BP: 124/82  Pulse: 72  Temp: 97.9 F (36.6 C)  Resp: 20    General: Alert, no acute distress Cardiovascular: No pedal edema Respiratory: No cyanosis, no use of accessory musculature GI: No organomegaly, abdomen is soft and non-tender Skin: No lesions in the area of chief complaint Neurologic: Sensation intact distally Psychiatric: Patient is competent for consent with normal mood and affect Lymphatic: No axillary or cervical lymphadenopathy  MUSCULOSKELETAL: + spurlings on right  Assessment/Plan: neck pain and right arm pain Plan for Procedure(s): ANTERIOR CERVICAL DECOMPRESSION/DISCECTOMY C5-C7   Emilee Hero, MD 08/03/2011 8:16 AM

## 2011-08-03 NOTE — Anesthesia Postprocedure Evaluation (Signed)
Anesthesia Post Note  Patient: [name]  Procedure(s) Performed: Procedure(s) (LRB): ANTERIOR CERVICAL DECOMPRESSION/DISCECTOMY FUSION 1 LEVEL (Right)  Anesthesia type: general  Patient location: PACU  Post pain: Pain level controlled  Post assessment: Patient's Cardiovascular Status Stable  Last Vitals:  Filed Vitals:   08/03/11 1218  BP:   Pulse: 71  Temp:   Resp: 12    Post vital signs: Reviewed and stable  Level of consciousness: sedated  Complications: No apparent anesthesia complications

## 2011-08-03 NOTE — Progress Notes (Signed)
Lunch relief per Mark RN 

## 2011-08-03 NOTE — Anesthesia Preprocedure Evaluation (Addendum)
Anesthesia Evaluation  Patient identified by MRN, date of birth, ID band Patient awake    Reviewed: Allergy & Precautions, H&P , NPO status , Patient's Chart, lab work & pertinent test results  History of Anesthesia Complications (+) PONV  Airway Mallampati: II TM Distance: >3 FB Neck ROM: Full    Dental  (+) Teeth Intact   Pulmonary former smoker         Cardiovascular negative cardio ROS      Neuro/Psych    GI/Hepatic negative GI ROS, Neg liver ROS,   Endo/Other  negative endocrine ROS  Renal/GU negative Renal ROS     Musculoskeletal negative musculoskeletal ROS (+)   Abdominal   Peds  Hematology negative hematology ROS (+)   Anesthesia Other Findings   Reproductive/Obstetrics                        Anesthesia Physical Anesthesia Plan  ASA: II  Anesthesia Plan: General   Post-op Pain Management:    Induction: Intravenous  Airway Management Planned: Oral ETT  Additional Equipment:   Intra-op Plan:   Post-operative Plan: Extubation in OR  Informed Consent: I have reviewed the patients History and Physical, chart, labs and discussed the procedure including the risks, benefits and alternatives for the proposed anesthesia with the patient or authorized representative who has indicated his/her understanding and acceptance.   Dental advisory given  Plan Discussed with: Surgeon and CRNA  Anesthesia Plan Comments:        Anesthesia Quick Evaluation

## 2011-08-03 NOTE — Preoperative (Signed)
Beta Blockers   Reason not to administer Beta Blockers:Not Applicable 

## 2011-08-03 NOTE — Transfer of Care (Signed)
Immediate Anesthesia Transfer of Care Note  Patient: [name]  Procedure(s) Performed: Procedure(s) (LRB): ANTERIOR CERVICAL DECOMPRESSION/DISCECTOMY FUSION 1 LEVEL (Right)  Patient Location: PACU  Anesthesia Type: General  Level of Consciousness: awake, alert  and oriented  Airway & Oxygen Therapy: Patient Spontanous Breathing and Patient connected to nasal cannula oxygen  Post-op Assessment: Report given to PACU RN, Post -op Vital signs reviewed and stable and Patient moving all extremities  Post vital signs: Reviewed and stable  Complications: No apparent anesthesia complications

## 2011-08-03 NOTE — Anesthesia Procedure Notes (Signed)
Procedure Name: Intubation Date/Time: 08/03/2011 8:39 AM Performed by: Neomia Dear, Adaleen Hulgan K Pre-anesthesia Checklist: Patient identified, Emergency Drugs available, Timeout performed, Suction available and Patient being monitored Patient Re-evaluated:Patient Re-evaluated prior to inductionOxygen Delivery Method: Circle system utilized Preoxygenation: Pre-oxygenation with 100% oxygen Intubation Type: IV induction Ventilation: Mask ventilation without difficulty Laryngoscope Size: Mac and 3 Grade View: Grade II Tube type: Oral Tube size: 7.5 mm Number of attempts: 1 Airway Equipment and Method: Stylet and LTA kit utilized Placement Confirmation: ETT inserted through vocal cords under direct vision,  breath sounds checked- equal and bilateral and positive ETCO2 Secured at: 23 cm Tube secured with: Tape Dental Injury: Teeth and Oropharynx as per pre-operative assessment

## 2011-08-04 ENCOUNTER — Encounter (HOSPITAL_COMMUNITY): Payer: Self-pay | Admitting: Orthopedic Surgery

## 2011-08-04 NOTE — Discharge Summary (Signed)
NAMEJORDANNE, ELSBURY NO.:  [id number]  MEDICAL RECORD NO.:  [id number]  LOCATION:  3526                         FACILITY:  [facility]  PHYSICIAN:  Estill Bamberg, MD      DATE OF BIRTH:  1960/11/26  DATE OF ADMISSION:  08/03/2011 DATE OF DISCHARGE:                              DISCHARGE SUMMARY   ADMISSION DIAGNOSES: 1. Right-sided cervical radiculopathy. 2. C5-6, C6-7 degenerative disk disease.  DISCHARGE DIAGNOSES: 1. Right-sided cervical radiculopathy. 2. C5-6, C6-7 degenerative disk disease.  SURGERY:  C5-C7 anterior cervical discectomy and fusion performed on Aug 03, 2011.  ADMISSION HISTORY:  Briefly, Ms. Dlugosz is a very pleasant 51 year old female, who initially presented to me with severe pain in her right arm, in her neck.  I did review an MRI which was notable for degenerative disk disease and stenosis at the C5-6 and C6-7 levels.  The patient did have conservative care and continued to have pain.  I; therefore, did have a discussion with the patient regarding going forward with the C5-6 and C6-7 anterior cervical decompression and fusion.  The patient was admitted on Aug 03, 2011, for the procedure noted above.  HOSPITAL COURSE:  The patient was admitted on the date noted above and had the procedure noted above.  She tolerated the procedure well and was transferred to recovery in stable condition.  The patient was evaluated by me postoperatively and on the morning of postoperative day #1.  The patient was noted to be neurovascularly intact.  She had very minimal discomfort in her neck and throat, and she did have entirely resolved right-sided arm pain.  The patient was uneventfully discharged home on the morning of postoperative day #1.  DISCHARGE INSTRUCTIONS:  The patient will take Percocet for pain and Valium for spasms.  She will wear her collar at all times.  She was given a [place] collar to be used while showering.  She will follow up in  my office in approximately 2 weeks after her surgery.     Estill Bamberg, MD     MD/MEDQ  D:  08/04/2011  T:  08/04/2011  Job:  [id number]

## 2011-08-04 NOTE — Progress Notes (Signed)
Patient's right arm pain resolved.  Minimal neck discomfort.  BP 141/84  Pulse 93  Temp(Src) 98.3 F (36.8 C) (Oral)  Resp 18  Wt 91.8 kg (202 lb 6.1 oz)  SpO2 94%  NVI Dressing CDI Collar well-positioned  POD #1 after C5-7 acdf  - continue aspen collar, [place] for showering - percocet/valium for pain - d/c home and f/u in 2 weeks

## 2012-11-22 ENCOUNTER — Other Ambulatory Visit: Payer: Self-pay

## 2012-11-22 DIAGNOSIS — Z1231 Encounter for screening mammogram for malignant neoplasm of breast: Secondary | ICD-10-CM

## 2012-12-11 ENCOUNTER — Ambulatory Visit
Admission: RE | Admit: 2012-12-11 | Discharge: 2012-12-11 | Disposition: A | Discharge status: Home / Self Care | Payer: BC Managed Care – PPO | Source: Ambulatory Visit

## 2012-12-11 DIAGNOSIS — Z1231 Encounter for screening mammogram for malignant neoplasm of breast: Secondary | ICD-10-CM

## 2013-03-22 IMAGING — CR DG CERVICAL SPINE 2 OR 3 VIEWS
2 series · 2 of 2 positions shown · non-contrast
Comparison: MRI from 06/03/2011

CLINICAL DATA: The cervical fusion.

CERVICAL SPINE - 2-3 VIEW

[view not recorded (1 of 2)]
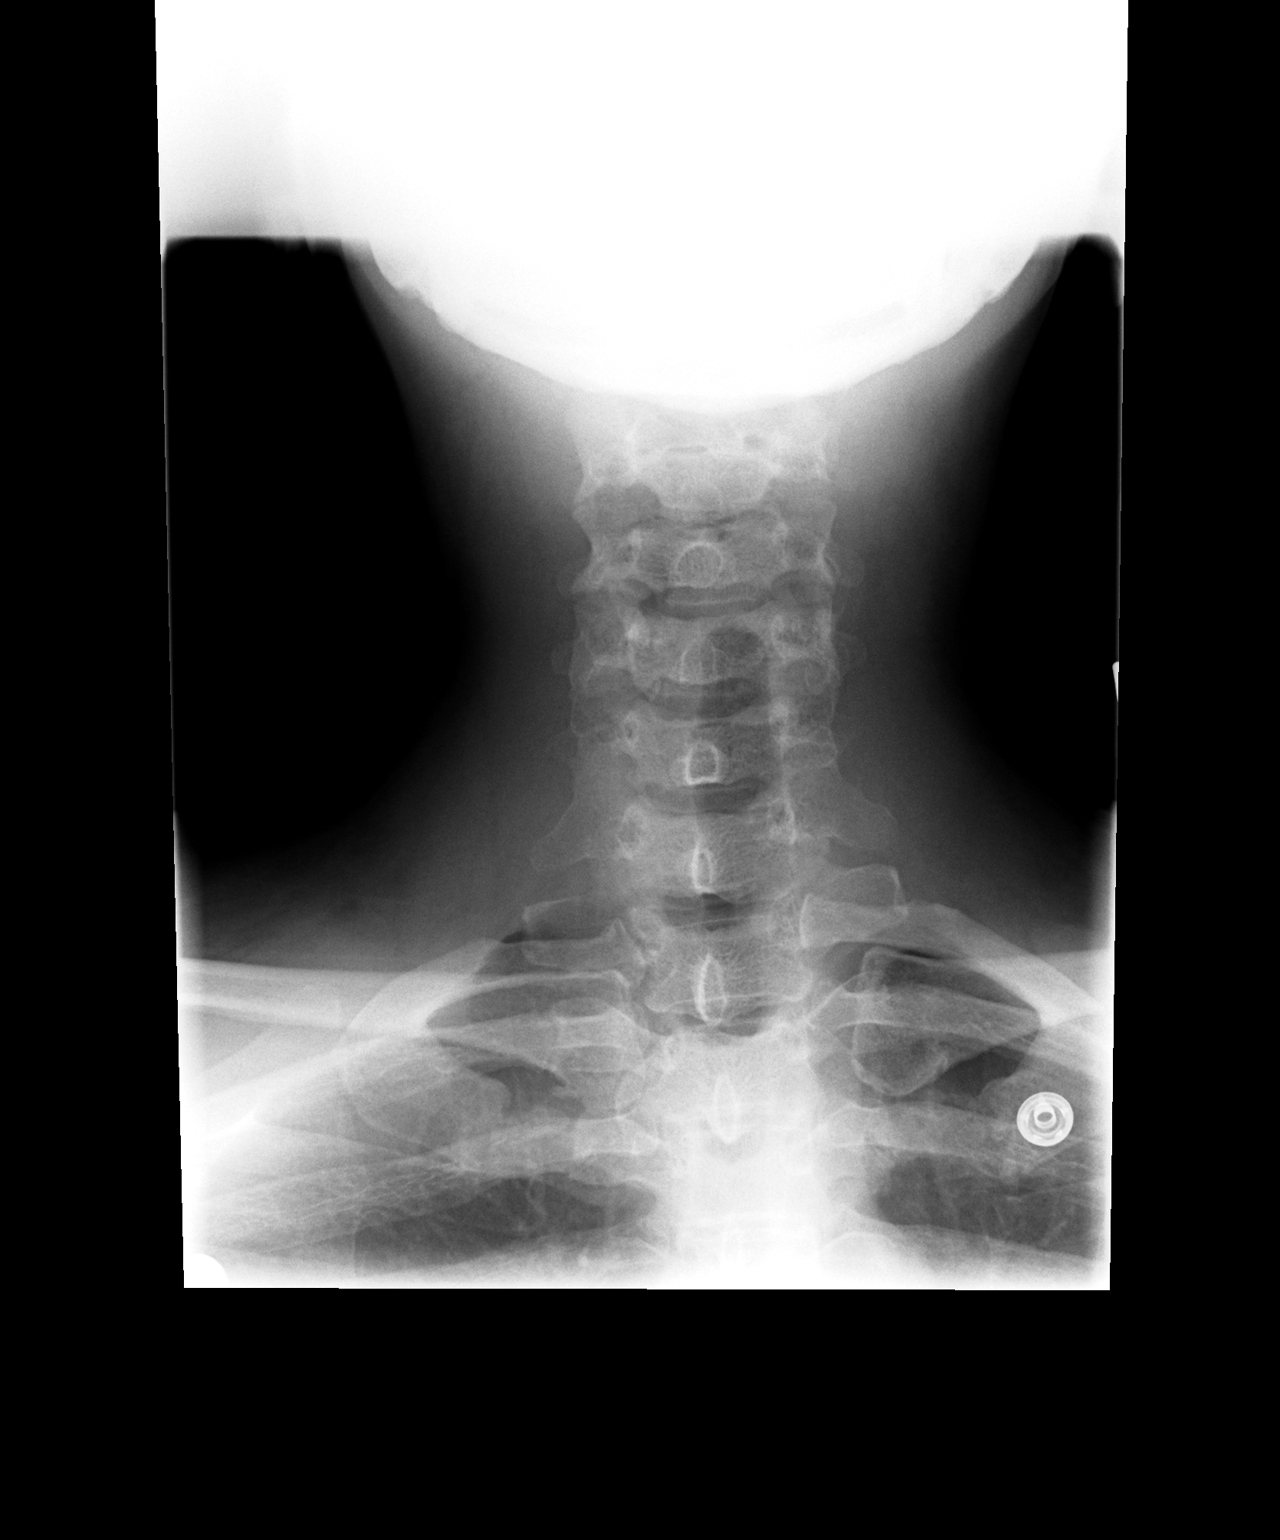

[view not recorded (2 of 2)]
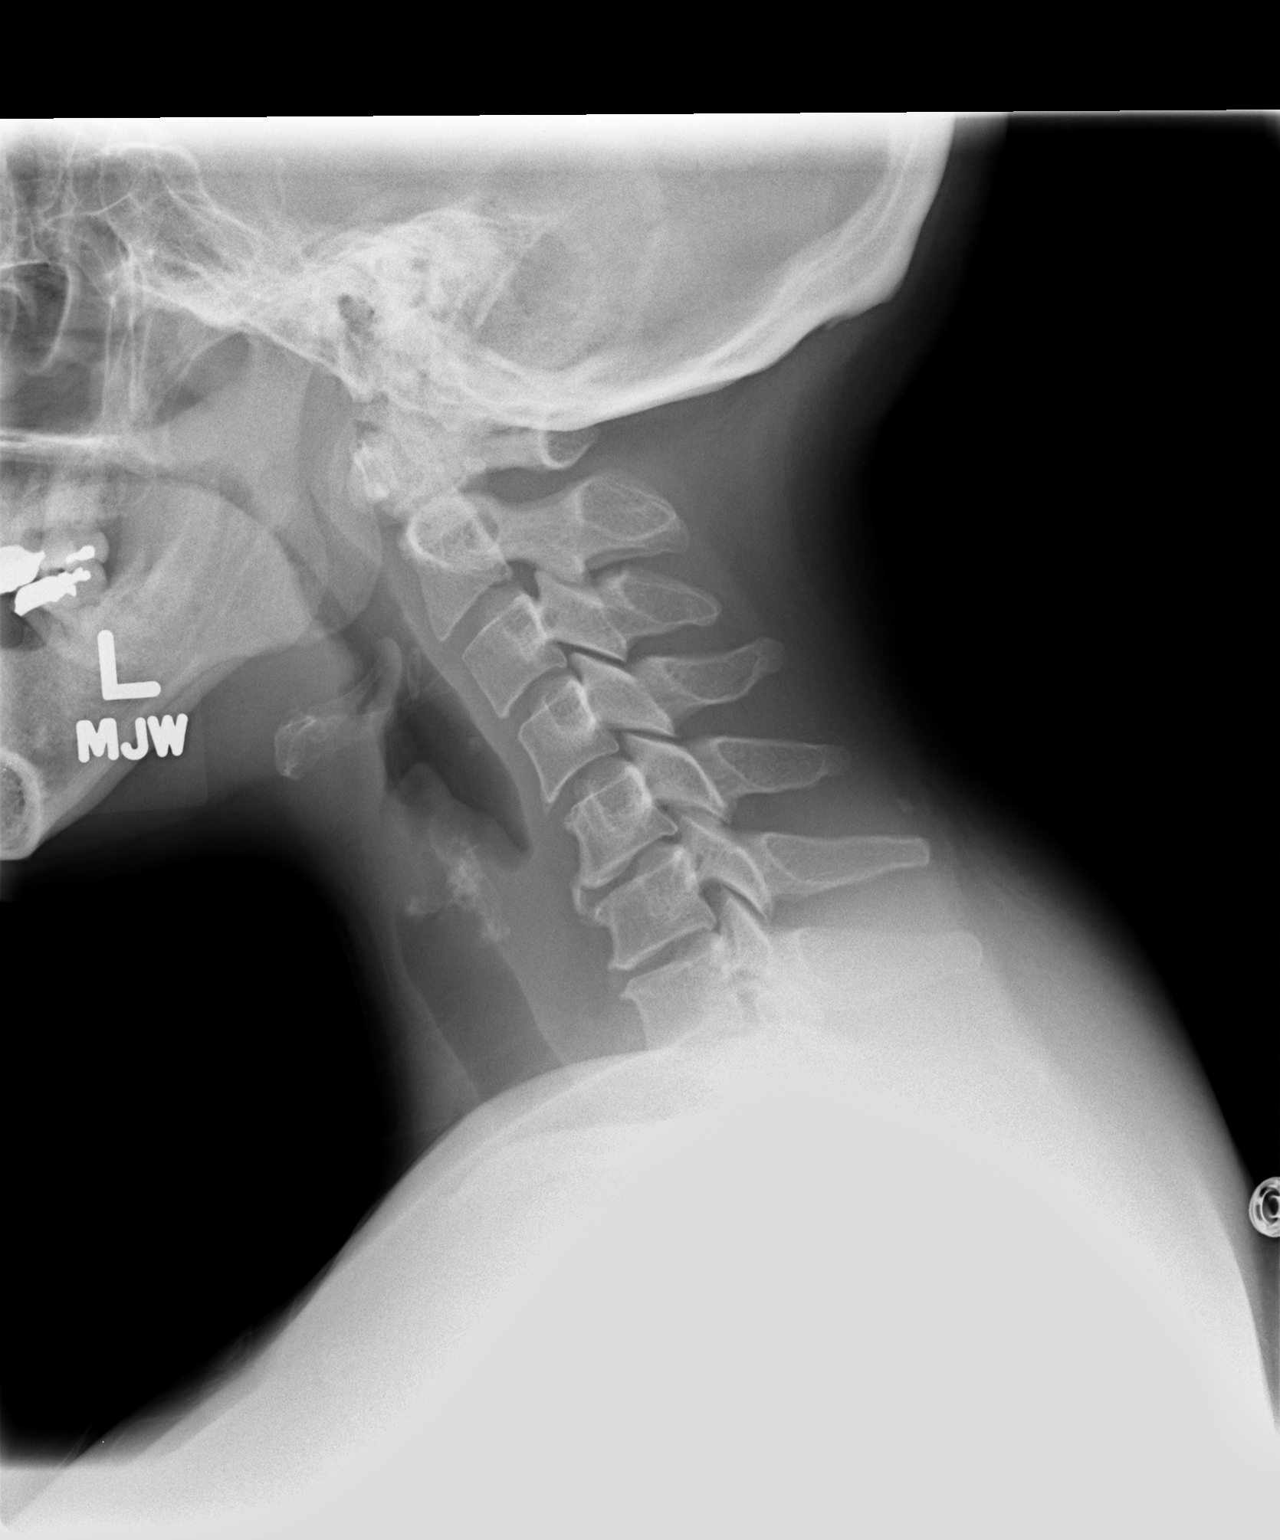

[2 of 2 positions shown; findings below may reference images not displayed]

FINDINGS: Two-view exam of the cervical spine shows loss of disc
height at C5-6 and C6-7.  No evidence for fracture.  No
subluxation.  No prevertebral soft tissue swelling.
IMPRESSION: Degenerative disc disease as C5-6 C6-7.

## 2013-12-06 ENCOUNTER — Ambulatory Visit (INDEPENDENT_AMBULATORY_CARE_PROVIDER_SITE_OTHER): Payer: BC Managed Care – PPO | Admitting: Internal Medicine

## 2013-12-06 VITALS — BP 124/82 | HR 74 | Temp 98.4°F | Resp 18 | Ht 64.0 in | Wt 211.0 lb

## 2013-12-06 DIAGNOSIS — M25559 Pain in unspecified hip: Secondary | ICD-10-CM

## 2013-12-06 DIAGNOSIS — M545 Low back pain, unspecified: Secondary | ICD-10-CM

## 2013-12-06 DIAGNOSIS — M25552 Pain in left hip: Secondary | ICD-10-CM

## 2013-12-06 MED ORDER — CYCLOBENZAPRINE HCL 10 MG PO TABS: 10.0000 mg | ORAL_TABLET | Freq: Every day | ORAL | Status: DC

## 2013-12-06 MED ORDER — MELOXICAM 15 MG PO TABS: 15.0000 mg | ORAL_TABLET | Freq: Every day | ORAL | Status: DC
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
## 2013-12-11 ENCOUNTER — Other Ambulatory Visit: Payer: Self-pay | Admitting: Internal Medicine

## 2013-12-11 NOTE — Telephone Encounter (Signed)
Pt needing refill on both rx     meloxicam (MOBIC) 15 MG tablet    cyclobenzaprine (FLEXERIL) 10 MG tablet [[id number]]

## 2013-12-12 NOTE — Telephone Encounter (Signed)
Dr Merla Riches, I see that you only gave #5 of ea. Do you want to RF either of these meds? Pended but didn't change the quantity.

## 2013-12-13 MED ORDER — MELOXICAM 15 MG PO TABS: 15.0000 mg | ORAL_TABLET | Freq: Every day | ORAL | Status: DC

## 2013-12-13 NOTE — Telephone Encounter (Signed)
Advised pt that RFs were sent but she needs to RTC if not better then. Pt agreed.

## 2013-12-13 NOTE — Telephone Encounter (Signed)
ok 

## 2014-01-13 ENCOUNTER — Telehealth: Payer: Self-pay | Admitting: Internal Medicine

## 2014-01-13 NOTE — Telephone Encounter (Signed)
Itemized billing statements needed for date of service 12/06/2013.    Thanks!

## 2014-09-09 ENCOUNTER — Other Ambulatory Visit (HOSPITAL_COMMUNITY): Payer: Self-pay | Admitting: Orthopaedic Surgery

## 2014-09-17 NOTE — Pre-Procedure Instructions (Addendum)
[name]  09/17/2014      [facility] - [place] ([place]), [place] - [address] [place] ([place]) [place] [address] Phone: [phone] Fax: [phone]    Your procedure is scheduled on Tues, June 28   Report to [facility] Admitting at 100 pm  Call this number if you have problems the morning of surgery:  [phone]   Remember:  Do not eat food or drink liquids after midnight.               Stop taking all your meds. No Goody's,BC's,Fish Oil,or any Herbal Medications.    Do not wear jewelry, make-up or nail polish.  Do not wear lotions, powders, or perfumes.  You may wear deodorant.  Do not shave 48 hours prior to surgery.    Do not bring valuables to the hospital.  [facility] is not responsible for any belongings or valuables.  Contacts, dentures or bridgework may not be worn into surgery.  Leave your suitcase in the car.  After surgery it may be brought to your room.  For patients admitted to the hospital, discharge time will be determined by your treatment team.  Patients discharged the day of surgery will not be allowed to drive home.    Special instructions:  [facility] - Preparing for Surgery  Before surgery, you can play an important role.  Because skin is not sterile, your skin needs to be as free of germs as possible.  You can reduce the number of germs on you skin by washing with CHG (chlorahexidine gluconate) soap before surgery.  CHG is an antiseptic cleaner which kills germs and bonds with the skin to continue killing germs even after washing.  Please DO NOT use if you have an allergy to CHG or antibacterial soaps.  If your skin becomes reddened/irritated stop using the CHG and inform your nurse when you arrive at Short Stay.  Do not shave (including legs and underarms) for at least 48 hours prior to the first CHG shower.  You may shave your face.  Please follow these instructions carefully:   1.  Shower with  CHG Soap the night before surgery and the                                morning of Surgery.  2.  If you choose to wash your hair, wash your hair first as usual with your       normal shampoo.  3.  After you shampoo, rinse your hair and body thoroughly to remove the                      Shampoo.  4.  Use CHG as you would any other liquid soap.  You can apply chg directly       to the skin and wash gently with scrungie or a clean washcloth.  5.  Apply the CHG Soap to your body ONLY FROM THE NECK DOWN.        Do not use on open wounds or open sores.  Avoid contact with your eyes,       ears, mouth and genitals (private parts).  Wash genitals (private parts)       with your normal soap.  6.  Wash thoroughly, paying special attention to the area where your surgery  will be performed.  7.  Thoroughly rinse your body with warm water from the neck down.  8.  DO NOT shower/wash with your normal soap after using and rinsing off       the CHG Soap.  9.  Pat yourself dry with a clean towel.            10.  Wear clean pajamas.            11.  Place clean sheets on your bed the night of your first shower and do not        sleep with pets.  Day of Surgery  Do not apply any lotions/deoderants the morning of surgery.  Please wear clean clothes to the hospital/surgery center.    Please read over the following fact sheets that you were given. Pain Booklet, Coughing and Deep Breathing, MRSA Information and Surgical Site Infection Prevention

## 2014-09-18 ENCOUNTER — Encounter (HOSPITAL_COMMUNITY): Payer: Self-pay

## 2014-09-18 ENCOUNTER — Encounter (HOSPITAL_COMMUNITY)
Admission: RE | Admit: 2014-09-18 | Discharge: 2014-09-18 | Disposition: A | Discharge status: Home / Self Care | Payer: BLUE CROSS/BLUE SHIELD | Source: Ambulatory Visit | Attending: Orthopaedic Surgery | Admitting: Orthopaedic Surgery

## 2014-09-18 DIAGNOSIS — Z01818 Encounter for other preprocedural examination: Secondary | ICD-10-CM | POA: Insufficient documentation

## 2014-09-18 LAB — BASIC METABOLIC PANEL
Anion gap: 9 (ref 5–15)
BUN: 13 mg/dL (ref 6–20)
CALCIUM: 9.4 mg/dL (ref 8.9–10.3)
CO2: 30 mmol/L (ref 22–32)
Chloride: 101 mmol/L (ref 101–111)
Creatinine, Ser: 0.98 mg/dL (ref 0.44–1.00)
Glucose, Bld: 99 mg/dL (ref 65–99)
POTASSIUM: 4 mmol/L (ref 3.5–5.1)
Sodium: 140 mmol/L (ref 135–145)

## 2014-09-18 LAB — CBC
HCT: 38.2 % (ref 36.0–46.0)
Hemoglobin: 13 g/dL (ref 12.0–15.0)
MCH: 27.5 pg (ref 26.0–34.0)
MCHC: 34 g/dL (ref 30.0–36.0)
MCV: 80.8 fL (ref 78.0–100.0)
PLATELETS: 239 10*3/uL (ref 150–400)
RBC: 4.73 MIL/uL (ref 3.87–5.11)
RDW: 14.1 % (ref 11.5–15.5)
WBC: 5.5 10*3/uL (ref 4.0–10.5)

## 2014-09-18 LAB — SURGICAL PCR SCREEN
MRSA, PCR: NEGATIVE
Staphylococcus aureus: NEGATIVE

## 2014-09-29 MED ORDER — CEFAZOLIN SODIUM-DEXTROSE 2-3 GM-% IV SOLR
2.0000 g | INTRAVENOUS | Status: AC
  Administered 2014-09-30: 2 g via INTRAVENOUS
  Filled 2014-09-29: qty 50

## 2014-09-29 NOTE — Progress Notes (Signed)
Patient notified to arrive at [id number]

## 2014-09-30 ENCOUNTER — Inpatient Hospital Stay (HOSPITAL_COMMUNITY): Payer: BLUE CROSS/BLUE SHIELD | Admitting: Anesthesiology

## 2014-09-30 ENCOUNTER — Inpatient Hospital Stay (HOSPITAL_COMMUNITY)
Admission: RE | Admit: 2014-09-30 | Discharge: 2014-10-02 | DRG: 470 | Disposition: A | Discharge status: Home Health | Payer: BLUE CROSS/BLUE SHIELD | Source: Ambulatory Visit | Attending: Orthopaedic Surgery | Admitting: Orthopaedic Surgery

## 2014-09-30 ENCOUNTER — Inpatient Hospital Stay (HOSPITAL_COMMUNITY): Payer: BLUE CROSS/BLUE SHIELD

## 2014-09-30 ENCOUNTER — Encounter (HOSPITAL_COMMUNITY): Payer: Self-pay | Admitting: *Deleted

## 2014-09-30 ENCOUNTER — Encounter (HOSPITAL_COMMUNITY)
Admission: RE | Disposition: A | Discharge status: Home Health | Payer: Self-pay | Source: Ambulatory Visit | Attending: Orthopaedic Surgery

## 2014-09-30 DIAGNOSIS — Z981 Arthrodesis status: Secondary | ICD-10-CM | POA: Diagnosis not present

## 2014-09-30 DIAGNOSIS — T40605A Adverse effect of unspecified narcotics, initial encounter: Secondary | ICD-10-CM | POA: Diagnosis not present

## 2014-09-30 DIAGNOSIS — M25552 Pain in left hip: Secondary | ICD-10-CM | POA: Diagnosis present

## 2014-09-30 DIAGNOSIS — Z87891 Personal history of nicotine dependence: Secondary | ICD-10-CM | POA: Diagnosis not present

## 2014-09-30 DIAGNOSIS — R112 Nausea with vomiting, unspecified: Secondary | ICD-10-CM | POA: Diagnosis not present

## 2014-09-30 DIAGNOSIS — M1612 Unilateral primary osteoarthritis, left hip: Secondary | ICD-10-CM

## 2014-09-30 DIAGNOSIS — Z419 Encounter for procedure for purposes other than remedying health state, unspecified: Secondary | ICD-10-CM

## 2014-09-30 DIAGNOSIS — Z96642 Presence of left artificial hip joint: Secondary | ICD-10-CM

## 2014-09-30 HISTORY — PX: TOTAL HIP ARTHROPLASTY: SHX124

## 2014-09-30 SURGERY — ARTHROPLASTY, HIP, TOTAL, ANTERIOR APPROACH
Anesthesia: Spinal | Site: Hip | Laterality: Left

## 2014-09-30 MED ORDER — FENTANYL CITRATE (PF) 100 MCG/2ML IJ SOLN
INTRAMUSCULAR | Status: DC | PRN
  Administered 2014-09-30 (×2): 25 ug via INTRAVENOUS

## 2014-09-30 MED ORDER — HYDROMORPHONE HCL 1 MG/ML IJ SOLN
INTRAMUSCULAR | Status: AC
  Filled 2014-09-30: qty 1

## 2014-09-30 MED ORDER — ONDANSETRON HCL 4 MG/2ML IJ SOLN
4.0000 mg | Freq: Four times a day (QID) | INTRAMUSCULAR | Status: DC | PRN
  Administered 2014-10-01: 4 mg via INTRAVENOUS
  Filled 2014-09-30: qty 2

## 2014-09-30 MED ORDER — ACETAMINOPHEN 650 MG RE SUPP: 650.0000 mg | Freq: Four times a day (QID) | RECTAL | Status: DC | PRN

## 2014-09-30 MED ORDER — METHOCARBAMOL 500 MG PO TABS
500.0000 mg | ORAL_TABLET | Freq: Four times a day (QID) | ORAL | Status: DC | PRN
  Administered 2014-09-30 – 2014-10-01 (×2): 500 mg via ORAL
  Filled 2014-09-30 (×2): qty 1

## 2014-09-30 MED ORDER — PHENOL 1.4 % MT LIQD
1.0000 | OROMUCOSAL | Status: DC | PRN
Start: 2014-09-30 — End: 2014-10-02

## 2014-09-30 MED ORDER — KETOROLAC TROMETHAMINE 30 MG/ML IJ SOLN
30.0000 mg | Freq: Once | INTRAMUSCULAR | Status: AC | PRN
  Administered 2014-09-30: 30 mg via INTRAVENOUS

## 2014-09-30 MED ORDER — METHOCARBAMOL 500 MG PO TABS
ORAL_TABLET | ORAL | Status: AC
  Filled 2014-09-30: qty 1

## 2014-09-30 MED ORDER — MIDAZOLAM HCL 5 MG/5ML IJ SOLN
INTRAMUSCULAR | Status: DC | PRN
  Administered 2014-09-30: 2 mg via INTRAVENOUS

## 2014-09-30 MED ORDER — PHENYLEPHRINE HCL 10 MG/ML IJ SOLN
INTRAMUSCULAR | Status: DC | PRN
Start: 2014-09-30 — End: 2014-09-30
  Administered 2014-09-30 (×4): 80 ug via INTRAVENOUS
  Administered 2014-09-30: 40 ug via INTRAVENOUS

## 2014-09-30 MED ORDER — SODIUM CHLORIDE 0.9 % IV SOLN
INTRAVENOUS | Status: DC
  Administered 2014-09-30: 22:00:00 via INTRAVENOUS

## 2014-09-30 MED ORDER — OXYCODONE HCL 5 MG PO TABS
ORAL_TABLET | ORAL | Status: AC
  Filled 2014-09-30: qty 2

## 2014-09-30 MED ORDER — METOCLOPRAMIDE HCL 5 MG PO TABS
5.0000 mg | ORAL_TABLET | Freq: Three times a day (TID) | ORAL | Status: DC | PRN
  Administered 2014-10-01: 5 mg via ORAL
  Filled 2014-09-30: qty 1

## 2014-09-30 MED ORDER — LIDOCAINE HCL (CARDIAC) 20 MG/ML IV SOLN
INTRAVENOUS | Status: DC | PRN
  Administered 2014-09-30: 60 mg via INTRAVENOUS

## 2014-09-30 MED ORDER — METHOCARBAMOL 1000 MG/10ML IJ SOLN
500.0000 mg | Freq: Four times a day (QID) | INTRAMUSCULAR | Status: DC | PRN
  Filled 2014-09-30: qty 5

## 2014-09-30 MED ORDER — ONDANSETRON HCL 4 MG/2ML IJ SOLN
INTRAMUSCULAR | Status: DC | PRN
  Administered 2014-09-30: 4 mg via INTRAVENOUS

## 2014-09-30 MED ORDER — EPHEDRINE SULFATE 50 MG/ML IJ SOLN
INTRAMUSCULAR | Status: DC | PRN
  Administered 2014-09-30 (×2): 5 mg via INTRAVENOUS
  Administered 2014-09-30: 10 mg via INTRAVENOUS
  Administered 2014-09-30 (×2): 5 mg via INTRAVENOUS

## 2014-09-30 MED ORDER — PROPOFOL 10 MG/ML IV BOLUS
INTRAVENOUS | Status: AC
  Filled 2014-09-30: qty 20

## 2014-09-30 MED ORDER — PROPOFOL INFUSION 10 MG/ML OPTIME
INTRAVENOUS | Status: DC | PRN
  Administered 2014-09-30: 50 ug/kg/min via INTRAVENOUS

## 2014-09-30 MED ORDER — MEPERIDINE HCL 25 MG/ML IJ SOLN: 6.2500 mg | INTRAMUSCULAR | Status: DC | PRN

## 2014-09-30 MED ORDER — FENTANYL CITRATE (PF) 250 MCG/5ML IJ SOLN
INTRAMUSCULAR | Status: AC
  Filled 2014-09-30: qty 5

## 2014-09-30 MED ORDER — ALUM & MAG HYDROXIDE-SIMETH 200-200-20 MG/5ML PO SUSP: 30.0000 mL | ORAL | Status: DC | PRN

## 2014-09-30 MED ORDER — MENTHOL 3 MG MT LOZG: 1.0000 | LOZENGE | OROMUCOSAL | Status: DC | PRN

## 2014-09-30 MED ORDER — PROMETHAZINE HCL 25 MG/ML IJ SOLN: 6.2500 mg | INTRAMUSCULAR | Status: DC | PRN

## 2014-09-30 MED ORDER — OXYCODONE HCL 5 MG PO TABS
5.0000 mg | ORAL_TABLET | ORAL | Status: DC | PRN
  Administered 2014-09-30 – 2014-10-02 (×7): 10 mg via ORAL
  Filled 2014-09-30 (×6): qty 2

## 2014-09-30 MED ORDER — ACETAMINOPHEN 325 MG PO TABS
650.0000 mg | ORAL_TABLET | Freq: Four times a day (QID) | ORAL | Status: DC | PRN
Start: 2014-09-30 — End: 2014-10-02
  Administered 2014-10-01: 650 mg via ORAL
  Filled 2014-09-30: qty 2

## 2014-09-30 MED ORDER — MIDAZOLAM HCL 2 MG/2ML IJ SOLN
INTRAMUSCULAR | Status: AC
  Filled 2014-09-30: qty 2

## 2014-09-30 MED ORDER — LACTATED RINGERS IV SOLN
INTRAVENOUS | Status: DC
  Administered 2014-09-30 (×3): via INTRAVENOUS

## 2014-09-30 MED ORDER — DOCUSATE SODIUM 100 MG PO CAPS
100.0000 mg | ORAL_CAPSULE | Freq: Two times a day (BID) | ORAL | Status: DC
  Administered 2014-09-30 – 2014-10-02 (×4): 100 mg via ORAL
  Filled 2014-09-30 (×4): qty 1

## 2014-09-30 MED ORDER — SENNOSIDES-DOCUSATE SODIUM 8.6-50 MG PO TABS: 1.0000 | ORAL_TABLET | Freq: Every evening | ORAL | Status: DC | PRN

## 2014-09-30 MED ORDER — DIPHENHYDRAMINE HCL 12.5 MG/5ML PO ELIX: 12.5000 mg | ORAL_SOLUTION | ORAL | Status: DC | PRN

## 2014-09-30 MED ORDER — SODIUM CHLORIDE 0.9 % IR SOLN
Status: DC | PRN
  Administered 2014-09-30: 3000 mL

## 2014-09-30 MED ORDER — ONDANSETRON HCL 4 MG PO TABS: 4.0000 mg | ORAL_TABLET | Freq: Four times a day (QID) | ORAL | Status: DC | PRN

## 2014-09-30 MED ORDER — TRAMADOL HCL 50 MG PO TABS: 100.0000 mg | ORAL_TABLET | Freq: Four times a day (QID) | ORAL | Status: DC | PRN

## 2014-09-30 MED ORDER — HYDROMORPHONE HCL 1 MG/ML IJ SOLN
1.0000 mg | INTRAMUSCULAR | Status: DC | PRN
  Administered 2014-10-01 (×3): 1 mg via INTRAVENOUS
  Filled 2014-09-30 (×3): qty 1

## 2014-09-30 MED ORDER — ASPIRIN EC 325 MG PO TBEC
325.0000 mg | DELAYED_RELEASE_TABLET | Freq: Two times a day (BID) | ORAL | Status: DC
  Administered 2014-10-01 – 2014-10-02 (×3): 325 mg via ORAL
  Filled 2014-09-30 (×3): qty 1

## 2014-09-30 MED ORDER — 0.9 % SODIUM CHLORIDE (POUR BTL) OPTIME
TOPICAL | Status: DC | PRN
  Administered 2014-09-30: 1000 mL

## 2014-09-30 MED ORDER — METOCLOPRAMIDE HCL 5 MG/ML IJ SOLN
5.0000 mg | Freq: Three times a day (TID) | INTRAMUSCULAR | Status: DC | PRN
Start: 2014-09-30 — End: 2014-10-02

## 2014-09-30 MED ORDER — HYDROMORPHONE HCL 1 MG/ML IJ SOLN
0.2500 mg | INTRAMUSCULAR | Status: DC | PRN
  Administered 2014-09-30 (×2): 0.5 mg via INTRAVENOUS

## 2014-09-30 MED ORDER — TRANEXAMIC ACID 1000 MG/10ML IV SOLN
1000.0000 mg | INTRAVENOUS | Status: AC
  Administered 2014-09-30: 1000 mg via INTRAVENOUS
  Filled 2014-09-30: qty 10

## 2014-09-30 MED ORDER — KETOROLAC TROMETHAMINE 30 MG/ML IJ SOLN
INTRAMUSCULAR | Status: AC
  Filled 2014-09-30: qty 1

## 2014-09-30 MED ORDER — ZOLPIDEM TARTRATE 5 MG PO TABS
5.0000 mg | ORAL_TABLET | Freq: Every evening | ORAL | Status: DC | PRN
  Filled 2014-09-30: qty 1

## 2014-09-30 MED ORDER — CEFAZOLIN SODIUM 1-5 GM-% IV SOLN
1.0000 g | Freq: Four times a day (QID) | INTRAVENOUS | Status: AC
  Administered 2014-09-30 – 2014-10-01 (×2): 1 g via INTRAVENOUS
  Filled 2014-09-30 (×2): qty 50

## 2014-09-30 SURGICAL SUPPLY — 56 items
APL SKNCLS STERI-STRIP NONHPOA (GAUZE/BANDAGES/DRESSINGS) ×1
BENZOIN TINCTURE PRP APPL 2/3 (GAUZE/BANDAGES/DRESSINGS) ×3 IMPLANT
BLADE SAW SGTL 18X1.27X75 (BLADE) ×2 IMPLANT
BLADE SAW SGTL 18X1.27X75MM (BLADE) ×1
BLADE SURG ROTATE 9660 (MISCELLANEOUS) IMPLANT
BNDG GAUZE ELAST 4 BULKY (GAUZE/BANDAGES/DRESSINGS) IMPLANT
CAPT HIP TOTAL 2 ×3 IMPLANT
CELLS DAT CNTRL 66122 CELL SVR (MISCELLANEOUS) ×1 IMPLANT
CLOSURE WOUND 1/2 X4 (GAUZE/BANDAGES/DRESSINGS) ×2
COVER SURGICAL LIGHT HANDLE (MISCELLANEOUS) ×3 IMPLANT
DRAPE C-ARM 42X72 X-RAY (DRAPES) ×3 IMPLANT
DRAPE IMP U-DRAPE 54X76 (DRAPES) ×3 IMPLANT
DRAPE STERI IOBAN 125X83 (DRAPES) ×3 IMPLANT
DRAPE U-SHAPE 47X51 STRL (DRAPES) ×9 IMPLANT
DRSG AQUACEL AG ADV 3.5X10 (GAUZE/BANDAGES/DRESSINGS) ×3 IMPLANT
DURAPREP 26ML APPLICATOR (WOUND CARE) ×3 IMPLANT
ELECT BLADE 4.0 EZ CLEAN MEGAD (MISCELLANEOUS)
ELECT BLADE 6.5 EXT (BLADE) IMPLANT
ELECT CAUTERY BLADE 6.4 (BLADE) ×3 IMPLANT
ELECT REM PT RETURN 9FT ADLT (ELECTROSURGICAL) ×3
ELECTRODE BLDE 4.0 EZ CLN MEGD (MISCELLANEOUS) IMPLANT
ELECTRODE REM PT RTRN 9FT ADLT (ELECTROSURGICAL) ×1 IMPLANT
FACESHIELD WRAPAROUND (MASK) ×6 IMPLANT
GLOVE BIOGEL PI IND STRL 8 (GLOVE) ×2 IMPLANT
GLOVE BIOGEL PI INDICATOR 8 (GLOVE) ×4
GLOVE ECLIPSE 8.0 STRL XLNG CF (GLOVE) ×3 IMPLANT
GLOVE ORTHO TXT STRL SZ7.5 (GLOVE) ×6 IMPLANT
GOWN STRL REUS W/ TWL LRG LVL3 (GOWN DISPOSABLE) ×2 IMPLANT
GOWN STRL REUS W/ TWL XL LVL3 (GOWN DISPOSABLE) ×2 IMPLANT
GOWN STRL REUS W/TWL LRG LVL3 (GOWN DISPOSABLE) ×4
GOWN STRL REUS W/TWL XL LVL3 (GOWN DISPOSABLE) ×6
HANDPIECE INTERPULSE COAX TIP (DISPOSABLE) ×3
KIT BASIN OR (CUSTOM PROCEDURE TRAY) ×3 IMPLANT
KIT ROOM TURNOVER OR (KITS) ×3 IMPLANT
MANIFOLD NEPTUNE II (INSTRUMENTS) ×3 IMPLANT
NS IRRIG 1000ML POUR BTL (IV SOLUTION) ×3 IMPLANT
PACK TOTAL JOINT (CUSTOM PROCEDURE TRAY) ×3 IMPLANT
PACK UNIVERSAL I (CUSTOM PROCEDURE TRAY) ×3 IMPLANT
PAD ARMBOARD 7.5X6 YLW CONV (MISCELLANEOUS) ×6 IMPLANT
RTRCTR WOUND ALEXIS 18CM MED (MISCELLANEOUS) ×3
SET HNDPC FAN SPRY TIP SCT (DISPOSABLE) ×1 IMPLANT
SPONGE LAP 4X18 X RAY DECT (DISPOSABLE) IMPLANT
STAPLER VISISTAT 35W (STAPLE) ×3 IMPLANT
STRIP CLOSURE SKIN 1/2X4 (GAUZE/BANDAGES/DRESSINGS) ×4 IMPLANT
SUT ETHIBOND NAB CT1 #1 30IN (SUTURE) ×3 IMPLANT
SUT MNCRL AB 4-0 PS2 18 (SUTURE) ×3 IMPLANT
SUT VIC AB 0 CT1 27 (SUTURE) ×3
SUT VIC AB 0 CT1 27XBRD ANBCTR (SUTURE) ×1 IMPLANT
SUT VIC AB 1 CT1 27 (SUTURE) ×3
SUT VIC AB 1 CT1 27XBRD ANBCTR (SUTURE) ×1 IMPLANT
SUT VIC AB 2-0 CT1 27 (SUTURE) ×3
SUT VIC AB 2-0 CT1 TAPERPNT 27 (SUTURE) ×1 IMPLANT
TOWEL OR 17X24 6PK STRL BLUE (TOWEL DISPOSABLE) ×3 IMPLANT
TOWEL OR 17X26 10 PK STRL BLUE (TOWEL DISPOSABLE) ×3 IMPLANT
TRAY FOLEY CATH 16FRSI W/METER (SET/KITS/TRAYS/PACK) IMPLANT
WATER STERILE IRR 1000ML POUR (IV SOLUTION) ×6 IMPLANT

## 2014-09-30 NOTE — Anesthesia Procedure Notes (Signed)
Spinal Patient location during procedure: OR Staffing Anesthesiologist: Nolon Nations Performed by: anesthesiologist  Preanesthetic Checklist Completed: patient identified, site marked, surgical consent, pre-op evaluation, timeout performed, IV checked, risks and benefits discussed and monitors and equipment checked Spinal Block Patient position: sitting Prep: Betadine Patient monitoring: heart rate, continuous pulse ox and blood pressure Approach: midline Location: L2-3 Injection technique: single-shot Needle Needle type: Sprotte  Needle gauge: 24 G Needle length: 9 cm Additional Notes Expiration date of kit checked and confirmed. Patient tolerated procedure well, without complications.

## 2014-09-30 NOTE — H&P (Signed)
TOTAL HIP ADMISSION H&P  Patient is admitted for left total hip arthroplasty.  Subjective:  Chief Complaint: left hip pain  HPI: [name], 54 y.o. female, has a history of pain and functional disability in the left hip(s) due to arthritis and patient has failed non-surgical conservative treatments for greater than 12 weeks to include NSAID's and/or analgesics and activity modification.  Onset of symptoms was abrupt starting 2 years ago with rapidlly worsening course since that time.The patient noted no past surgery on the left hip(s).  Patient currently rates pain in the left hip at 9 out of 10 with activity. Patient has night pain, worsening of pain with activity and weight bearing, pain that interfers with activities of daily living and pain with passive range of motion. Patient has evidence of subchondral cysts, subchondral sclerosis, periarticular osteophytes and joint space narrowing by imaging studies. This condition presents safety issues increasing the risk of falls.  There is no current active infection.  Patient Active Problem List   Diagnosis Date Noted  . Osteoarthritis of left hip 09/30/2014   Past Medical History  Diagnosis Date  . No pertinent past medical history   . PONV (postoperative nausea and vomiting)     pt believes due to morphine    Past Surgical History  Procedure Laterality Date  . Abdominal hysterectomy    . Shoulder open rotator cuff repair  2004, 2006    right shoulder  x2  . Carpal tunnel release  2004    right hand  . Eye surgery      lasik  . Anterior cervical decomp/discectomy fusion  08/03/2011    Procedure: ANTERIOR CERVICAL DECOMPRESSION/DISCECTOMY FUSION 1 LEVEL;  Surgeon: Emilee HeroMark Leonard Dumonski, MD;  [facility];  Service: Orthopedics;  Laterality: Right;  Anterior  cervical decompression fusion, cervical 5-6, cervical 6-7 with instrumentation and allograft    No prescriptions prior to admission   Allergies  Allergen Reactions  .  Morphine And Related Shortness Of Breath    Non responsive; admitted to ICU    History  Substance Use Topics  . Smoking status: Former Smoker -- 0.25 packs/day for 10 years    Types: Cigarettes  . Smokeless tobacco: Never Used     Comment: quit smoking 25 yrs   ago  . Alcohol Use: No    Family History  Problem Relation Age of Onset  . Anesthesia problems Neg Hx      Review of Systems  Musculoskeletal: Positive for joint pain.  All other systems reviewed and are negative.   Objective:  Physical Exam  Constitutional: She is oriented to person, place, and time. She appears well-developed and well-nourished.  HENT:  Head: Normocephalic and atraumatic.  Eyes: EOM are normal. Pupils are equal, round, and reactive to light.  Neck: Normal range of motion. Neck supple.  Cardiovascular: Normal rate and regular rhythm.   Respiratory: Effort normal and breath sounds normal.  GI: Soft. Bowel sounds are normal.  Musculoskeletal:       Left hip: She exhibits decreased range of motion, decreased strength, tenderness and bony tenderness.  Neurological: She is alert and oriented to person, place, and time.  Skin: Skin is warm and dry.  Psychiatric: She has a normal mood and affect.    Vital signs in last 24 hours:    Labs:   Estimated body mass index is 36.20 kg/(m^2) as calculated from the following:   Height as of 12/06/13: 5\' 4"  (1.626 m).   Weight as  of 12/06/13: 95.709 kg (211 lb).   Imaging Review Plain radiographs demonstrate severe degenerative joint disease of the left hip(s). The bone quality appears to be excellent for age and reported activity level.  Assessment/Plan:  End stage arthritis, left hip(s)  The patient history, physical examination, clinical judgement of the provider and imaging studies are consistent with end stage degenerative joint disease of the left hip(s) and total hip arthroplasty is deemed medically necessary. The treatment options including medical  management, injection therapy, arthroscopy and arthroplasty were discussed at length. The risks and benefits of total hip arthroplasty were presented and reviewed. The risks due to aseptic loosening, infection, stiffness, dislocation/subluxation,  thromboembolic complications and other imponderables were discussed.  The patient acknowledged the explanation, agreed to proceed with the plan and consent was signed. Patient is being admitted for inpatient treatment for surgery, pain control, PT, OT, prophylactic antibiotics, VTE prophylaxis, progressive ambulation and ADL's and discharge planning.The patient is planning to be discharged home with home health services

## 2014-09-30 NOTE — Brief Op Note (Signed)
09/30/2014  5:43 PM  PATIENT:  [name]  54 y.o. female  PRE-OPERATIVE DIAGNOSIS:  primary osteoarthritis left hip  POST-OPERATIVE DIAGNOSIS:  primary osteoarthritis left hip  PROCEDURE:  Procedure(s): LEFT TOTAL HIP ARTHROPLASTY ANTERIOR APPROACH (Left)  SURGEON:  Surgeon(s) and Role:    * Kathryne Hitchhristopher Y Roselynn Whitacre, MD - Primary  PHYSICIAN ASSISTANT: Rexene EdisonGil Clark, PA-C  ANESTHESIA:   spinal  EBL:  Total I/O In: 1000 [I.V.:1000] Out: 350 [Blood:350]  BLOOD ADMINISTERED:none  DRAINS: none   LOCAL MEDICATIONS USED:  NONE  SPECIMEN:  No Specimen  DISPOSITION OF SPECIMEN:  N/A  COUNTS:  YES  TOURNIQUET:  * No tourniquets in log *  DICTATION: .Other Dictation: Dictation Number [id number]  PLAN OF CARE: Admit to inpatient   PATIENT DISPOSITION:  PACU - hemodynamically stable.   Delay start of Pharmacological VTE agent (>24hrs) due to surgical blood loss or risk of bleeding: no

## 2014-09-30 NOTE — Anesthesia Preprocedure Evaluation (Addendum)
Anesthesia Evaluation  Patient identified by MRN, date of birth, ID band Patient awake    Reviewed: Allergy & Precautions, H&P , NPO status , Patient's Chart, lab work & pertinent test results  History of Anesthesia Complications (+) PONV and history of anesthetic complications  Airway Mallampati: II  TM Distance: >3 FB Neck ROM: Full    Dental no notable dental hx. (+) Teeth Intact   Pulmonary former smoker,  breath sounds clear to auscultation  Pulmonary exam normal       Cardiovascular negative cardio ROS Normal cardiovascular examRhythm:Regular Rate:Normal     Neuro/Psych    GI/Hepatic negative GI ROS, Neg liver ROS,   Endo/Other  negative endocrine ROS  Renal/GU negative Renal ROS     Musculoskeletal  (+) Arthritis -,   Abdominal   Peds  Hematology negative hematology ROS (+)   Anesthesia Other Findings   Reproductive/Obstetrics                            Anesthesia Physical  Anesthesia Plan  ASA: II  Anesthesia Plan: Spinal   Post-op Pain Management:    Induction:   Airway Management Planned:   Additional Equipment:   Intra-op Plan:   Post-operative Plan:   Informed Consent: I have reviewed the patients History and Physical, chart, labs and discussed the procedure including the risks, benefits and alternatives for the proposed anesthesia with the patient or authorized representative who has indicated his/her understanding and acceptance.   Dental advisory given  Plan Discussed with: CRNA  Anesthesia Plan Comments:         Anesthesia Quick Evaluation

## 2014-09-30 NOTE — Transfer of Care (Signed)
Immediate Anesthesia Transfer of Care Note  Patient: [name]  Procedure(s) Performed: Procedure(s): LEFT TOTAL HIP ARTHROPLASTY ANTERIOR APPROACH (Left)  Patient Location: PACU  Anesthesia Type:Spinal  Level of Consciousness: awake, alert  and oriented  Airway & Oxygen Therapy: Patient Spontanous Breathing  Post-op Assessment: Report given to RN and Post -op Vital signs reviewed and stable  Post vital signs: Reviewed and stable  Last Vitals:  Filed Vitals:   09/30/14 1242  BP: 148/73  Pulse: 99  Temp: 36.6 C  Resp: 20    Complications: No apparent anesthesia complications

## 2014-10-01 ENCOUNTER — Encounter (HOSPITAL_COMMUNITY): Payer: Self-pay | Admitting: Orthopaedic Surgery

## 2014-10-01 LAB — BASIC METABOLIC PANEL
ANION GAP: 9 (ref 5–15)
BUN: 12 mg/dL (ref 6–20)
CHLORIDE: 102 mmol/L (ref 101–111)
CO2: 29 mmol/L (ref 22–32)
Calcium: 8.9 mg/dL (ref 8.9–10.3)
Creatinine, Ser: 0.9 mg/dL (ref 0.44–1.00)
GFR calc non Af Amer: >60 mL/min (ref >60)
Glucose, Bld: 131 mg/dL — ABNORMAL HIGH (ref 65–99)
POTASSIUM: 4.2 mmol/L (ref 3.5–5.1)
Sodium: 140 mmol/L (ref 135–145)

## 2014-10-01 LAB — CBC
HCT: 33.4 % — ABNORMAL LOW (ref 36.0–46.0)
Hemoglobin: 11 g/dL — ABNORMAL LOW (ref 12.0–15.0)
MCH: 26.8 pg (ref 26.0–34.0)
MCHC: 32.9 g/dL (ref 30.0–36.0)
MCV: 81.5 fL (ref 78.0–100.0)
Platelets: 211 10*3/uL (ref 150–400)
RBC: 4.1 MIL/uL (ref 3.87–5.11)
RDW: 14.2 % (ref 11.5–15.5)
WBC: 8.5 10*3/uL (ref 4.0–10.5)

## 2014-10-01 MED ORDER — PROMETHAZINE HCL 12.5 MG PO TABS: 12.5000 mg | ORAL_TABLET | Freq: Three times a day (TID) | ORAL | Status: AC | PRN

## 2014-10-01 MED ORDER — METHOCARBAMOL 500 MG PO TABS: 500.0000 mg | ORAL_TABLET | Freq: Four times a day (QID) | ORAL | Status: AC | PRN

## 2014-10-01 MED ORDER — OXYCODONE-ACETAMINOPHEN 5-325 MG PO TABS: 1.0000 | ORAL_TABLET | ORAL | Status: AC | PRN

## 2014-10-01 MED ORDER — ASPIRIN 325 MG PO TBEC: 325.0000 mg | DELAYED_RELEASE_TABLET | Freq: Two times a day (BID) | ORAL | Status: AC

## 2014-10-01 MED ORDER — DOCUSATE SODIUM 100 MG PO CAPS: 100.0000 mg | ORAL_CAPSULE | Freq: Two times a day (BID) | ORAL | Status: AC | PRN

## 2014-10-01 NOTE — Discharge Instructions (Signed)

## 2014-10-01 NOTE — Progress Notes (Signed)
Subjective: 1 Day Post-Op Procedure(s) (LRB): LEFT TOTAL HIP ARTHROPLASTY ANTERIOR APPROACH (Left) Patient reports pain as moderate.  Has had nausea and vomiting this am - likely secondary to intolerance to narcotics.  Vitals stable.  Objective: Vital signs in last 24 hours: Temp:  [97.6 F (36.4 C)-98.3 F (36.8 C)] 98.1 F (36.7 C) (06/29 0457) Pulse Rate:  [57-99] 75 (06/29 0457) Resp:  [13-20] 16 (06/29 0457) BP: (101-148)/(50-99) 116/69 mmHg (06/29 0457) SpO2:  [96 %-100 %] 97 % (06/29 0457) Weight:  [97.07 kg (214 lb)] 97.07 kg (214 lb) (06/28 1242)  Intake/Output from previous day: 06/28 0701 - 06/29 0700 In: 2517.5 [P.O.:580; I.V.:1937.5] Out: 1075 [Urine:725; Blood:350] Intake/Output this shift:     Recent Labs  10/01/14 0446  HGB 11.0*    Recent Labs  10/01/14 0446  WBC 8.5  RBC 4.10  HCT 33.4*  PLT 211    Recent Labs  10/01/14 0446  NA 140  K 4.2  CL 102  CO2 29  BUN 12  CREATININE 0.90  GLUCOSE 131*  CALCIUM 8.9   No results for input(s): LABPT, INR in the last 72 hours.  Sensation intact distally Intact pulses distally Dorsiflexion/Plantar flexion intact Incision: dressing C/D/I  Assessment/Plan: 1 Day Post-Op Procedure(s) (LRB): LEFT TOTAL HIP ARTHROPLASTY ANTERIOR APPROACH (Left) Up with therapy  Ellinore Merced Y 10/01/2014, 7:33 AM

## 2014-10-01 NOTE — Anesthesia Postprocedure Evaluation (Signed)
  Anesthesia Post-op Note  Patient: [name]  Procedure(s) Performed: Procedure(s): LEFT TOTAL HIP ARTHROPLASTY ANTERIOR APPROACH (Left)  Patient Location: PACU  Anesthesia Type:MAC and Spinal  Level of Consciousness: awake, alert  and oriented  Airway and Oxygen Therapy: Patient Spontanous Breathing and Patient connected to nasal cannula oxygen  Post-op Pain: mild  Post-op Assessment: Post-op Vital signs reviewed, Patient's Cardiovascular Status Stable, Respiratory Function Stable, Patent Airway and Pain level controlled LLE Motor Response: Purposeful movement, Responds to commands LLE Sensation: Full sensation, No numbness, No tingling RLE Motor Response: Purposeful movement, Responds to commands RLE Sensation: Full sensation, No numbness, No tingling L Sensory Level: S1-Sole of foot, small toes R Sensory Level: S1-Sole of foot, small toes  Post-op Vital Signs: stable  Last Vitals:  Filed Vitals:   10/01/14 0457  BP: 116/69  Pulse: 75  Temp: 36.7 C  Resp: 16    Complications: No apparent anesthesia complications

## 2014-10-01 NOTE — Evaluation (Signed)
Physical Therapy Evaluation Patient Details Name: [name] MRN: [id number] DOB: 10-Feb-1961 Today's Date: 10/01/2014   History of Present Illness  Patient is a 54 y/o female s/p L THA, direct anterior approach.   Clinical Impression  Patient presents with pain and post surgical deficits LLE s/p L THA impacting mobility. Required Min A for bed mobility and transfers secondary to pain/weakness. Pt will have 24/7 S at home from husband for 1 week at d/c. Would benefit from skilled PT and HHPT to maximize independence and mobility prior to return home. Will plan for gait training in PM session.    Follow Up Recommendations Home health PT;Supervision/Assistance - 24 hour    Equipment Recommendations  None recommended by PT    Recommendations for Other Services       Precautions / Restrictions Precautions Precautions: Fall Precaution Comments: direct anterior approach. Restrictions Weight Bearing Restrictions: Yes LLE Weight Bearing: Weight bearing as tolerated      Mobility  Bed Mobility Overal bed mobility: Needs Assistance Bed Mobility: Supine to Sit     Supine to sit: Min assist;HOB elevated     General bed mobility comments: Min A to bring LLE to EOB. Instructed pt to use RLE to assist or sheet as leg lifter - attempted sheet.  Transfers Overall transfer level: Needs assistance Equipment used: Rolling walker (2 wheeled) Transfers: Sit to/from Stand Sit to Stand: Min assist         General transfer comment: MIn A to boost to standing from EOB with cues for hand placement and technique. manual cues for upright posture and hip extension.  Ambulation/Gait Ambulation/Gait assistance: Min guard Ambulation Distance (Feet): 8 Feet Assistive device: Rolling walker (2 wheeled) Gait Pattern/deviations: Step-to pattern;Decreased stance time - left;Decreased step length - right;Trunk flexed;Antalgic   Gait velocity interpretation: Below normal speed for  age/gender General Gait Details: Pt with slow, unsteady gait. Increased WB through BUEs to offload LLE.  Stairs            Wheelchair Mobility    Modified Rankin (Stroke Patients Only)       Balance Overall balance assessment: Needs assistance Sitting-balance support: Feet supported;Bilateral upper extremity supported Sitting balance-Leahy Scale: Fair Sitting balance - Comments: Posteior trunk lean as position of comfort to offload left hip. Postural control: Posterior lean   Standing balance-Leahy Scale: Poor Standing balance comment: Relient on RW for support.                              Pertinent Vitals/Pain Pain Assessment: Faces Faces Pain Scale: Hurts even more Pain Location: left hip Pain Descriptors / Indicators: Sore;Aching Pain Intervention(s): Monitored during session;Repositioned    Home Living Family/patient expects to be discharged to:: Private residence Living Arrangements: Spouse/significant other;Children Available Help at Discharge: Family;Available 24 hours/day (Spouse taking 1 week off work.) Type of Home: House Home Access: Stairs to enter Entrance Stairs-Rails: Left Entrance Stairs-Number of Steps: 7 Home Layout: Multi-level Home Equipment: None Additional Comments: Pt reports she will borrow a RW.    Prior Function Level of Independence: Independent               Hand Dominance        Extremity/Trunk Assessment   Upper Extremity Assessment: Defer to OT evaluation           Lower Extremity Assessment: LLE deficits/detail   LLE Deficits / Details: Limited AROM hip flexion secondary to pain and surgery.  Able to perform LAQ.     Communication   Communication: No difficulties  Cognition Arousal/Alertness: Awake/alert Behavior During Therapy: WFL for tasks assessed/performed Overall Cognitive Status: Within Functional Limits for tasks assessed                      General Comments      Exercises  Total Joint Exercises Ankle Circles/Pumps: Both;10 reps;Supine Quad Sets: Both;10 reps;Supine Gluteal Sets: Both;10 reps;Supine Long Arc Quad: Left;AAROM;10 reps;Seated      Assessment/Plan    PT Assessment Patient needs continued PT services  PT Diagnosis Acute pain;Generalized weakness;Abnormality of gait   PT Problem List Decreased strength;Pain;Decreased range of motion;Decreased balance;Decreased mobility;Decreased knowledge of use of DME;Decreased activity tolerance  PT Treatment Interventions Balance training;Gait training;Stair training;Functional mobility training;Therapeutic activities;Therapeutic exercise;Patient/family education;DME instruction   PT Goals (Current goals can be found in the Care Plan section) Acute Rehab PT Goals Patient Stated Goal: to get back to cycling and swimming PT Goal Formulation: With patient Time For Goal Achievement: 10/15/14 Potential to Achieve Goals: Good    Frequency 7X/week   Barriers to discharge Inaccessible home environment Pt has 7 steps to navigate to get to bedroom.    Co-evaluation               End of Session Equipment Utilized During Treatment: Gait belt Activity Tolerance: Patient tolerated treatment well;Patient limited by pain Patient left: in chair;with call bell/phone within reach Nurse Communication: Mobility status         Time: 1610-9604 PT Time Calculation (min) (ACUTE ONLY): 32 min   Charges:   PT Evaluation $Initial PT Evaluation Tier I: 1 Procedure PT Treatments $Therapeutic Activity: 8-22 mins   PT G Codes:        [name] 10/01/2014, 11:42 AM Mylo Red, PT, DPT [phone]

## 2014-10-01 NOTE — Progress Notes (Signed)
UP TO BR PER WALKER MOD ASSIST TOILETING AND SELFCARE, NO DISTRESS NOTED. FAMILY AT BEDSIDE. INSTRUCTED ON MOBILITY ISSUES.

## 2014-10-01 NOTE — Op Note (Signed)
NAMCasandra Doffing:  Garrison, Andrea                ACCOUNT NO.:  [id number]  MEDICAL RECORD NO.:  [id number]  LOCATION:  [id number]                        FACILITY:  [facility]  PHYSICIAN:  Vanita PandaChristopher Y. Magnus IvanBlackman, M.D.DATE OF BIRTH:  30-Oct-1960  DATE OF PROCEDURE:  09/30/2014 DATE OF DISCHARGE:                              OPERATIVE REPORT   PREOPERATIVE DIAGNOSES:  Primary osteoarthritis and degenerative joint disease, left hip.  POSTOPERATIVE DIAGNOSES:  Primary osteoarthritis and degenerative joint disease, left hip.  PROCEDURE:  Left total hip arthroplasty through direct anterior approach.  IMPLANTS:  DePuy Sector Gription acetabular component size 48, size 32+ 4 neutral polyethylene liner, size 10 Corail femoral component with varus offset (KLA), size 32+ 1 ceramic hip ball.  SURGEON:  Vanita PandaChristopher Y. Magnus IvanBlackman, M.D.  ASSISTANT:  Richardean CanalGilbert Clark, PA-C.  ANESTHESIA:  Spinal.  BLOOD LOSS:  350 mL.  ANTIBIOTICS:  2 g of IV Ancef.  COMPLICATIONS:  None.  INDICATIONS:  [name] is a 54 year old female, well known to me.  She has debilitating left hip pain and radiographic evidence showing complete loss of the superolateral aspect of her left hip.  There were significant periarticular osteophytes and sclerotic changes with joint space narrowing.  In the combination of these things, she has developed quite significant hip pain.  She walks with limp and her leg is slightly shoulder, left than the right.  She has tried intra-articular steroid injection and I did subside her pain for short period of time, but it has gotten worse, affected her mobility, her quality of life and her activities of daily living.  At this point, she does wish to proceed with total hip arthroplasty and she would like this done through the direct anterior approach.  I talked to her about the risks and benefits of surgery including the risk of acute blood loss anemia, nerve and vessel injury, fracture, infection, dislocation,  DVT.  She understands the goals are decreased pain, improved mobility and overall improved quality of life.  PROCEDURE DESCRIPTION:  After informed consent was obtained, appropriate left hip was marked.  She was brought to the operating room, and while she was on the stretcher, spinal anesthesia was obtained.  We then had a Foley catheter placed and both feet had traction boots applied to them. Next, she was placed supine on the Hana fracture table with the perineal post in place and both legs in inline skeletal traction devices, but no traction applied.  Her left operative hip was then prepped and draped with DuraPrep and sterile drapes.  A time-out was called, she was identified as correct patient and correct left hip.  I then made my incision just inferior and posterior to the anterior superior iliac spine and carried this obliquely down the leg.  I dissected down the tensor fascia lata muscle and the tensor fascia was then divided longitudinally, so that I could proceed with a direct anterior approach to the hip.  I identified and cauterized the lateral femoral circumflex vessels and then identified the hip capsule.  I placed the Cobra retractor around the lateral femoral neck and up underneath the rectus femoris on the medial femoral neck.  I then opened up the hip capsule  in an L-type format and finding a large joint effusion and periarticular osteophytes.  I placed the Cobra retractor within the joint capsule and then was able to make my femoral neck cut under direct visualization just proximal to the lesser trochanter.  I completed this with an osteotome.  I placed a corkscrew guide in the femoral head and removed the femoral head in its entirety and found it to be devoid of cartilage. I then cleaned the acetabulum, the remnants of acetabular labrum and other debris.  I placed a bent Hohmann over the medial acetabular rim and then began reaming under direct visualization from a  size 42 reamer in 2 mm increments up to a normal size 48.  The last reamer was also placed under direct fluoroscopy so I could obtain my depth of reaming, our inclination, and abduction as well as anteversion.  Once I was pleased with this, I placed the real DePuy Sector Gription acetabular component size 48 and the real 32+ 4 polyethylene liner.  Attention was then turned to the femur.  With the leg externally rotated to 100 degrees, extended and adducted, I was able to use a Mueller retractor medially and a Hohmann retractor behind the greater trochanter.  I released the lateral joint capsule and used a box cutting osteotome to enter the femoral canal and a rongeur to lateralize.  I then began broaching using the Corail broaching system from a size 8 to a size 10. Based on her anatomy and preoperative films, we trialed a varus offset femoral neck and 32 +1 hip ball and reduced this in the acetabulum.  We were pleased with stability, range of motion, offset and leg lengths. We then dislocated the hip and removed the trial components.  Next, we placed the real Corail femoral component, size 10 with varus offset and the real 32+ 1 ceramic hip ball.  We reduced this back in the acetabulum and we were pleased with stability.  We then copiously irrigated the soft tissue with normal saline solution using pulsatile lavage.  We closed the joint capsule with interrupted #1 Ethibond suture followed by running #1 Vicryl in the tensor fascia, 0 Vicryl in the deep tissue, 2-0 Vicryl in the subcutaneous tissue, interrupted staples on the skin, Xeroform and an Aquacel dressing.  She was then taken off the Hana table and taken to the recovery room in stable condition.  All final counts were correct.  There were no complications noted.  Again, final blood loss was 350 mL.  Richardean Canal, PA-C assisted the entire case and his assistance was crucial for facilitating all aspects of this  case.     Vanita Panda. Magnus Ivan, M.D.     CYB/MEDQ  D:  09/30/2014  T:  10/01/2014  Job:  [id number]

## 2014-10-01 NOTE — Care Management Note (Signed)
Case Management Note  Patient Details  Name: [name] MRN: [id number] Date of Birth: 1960/05/05  Subjective/Objective:          S/p left total knee arthroplasty          Action/Plan: Set up with [name] for HHPT by MD office. Spoke with patient, no change in discharge plan. Patient states that she will have family available to assist after discharge. She stated that she has a rolling walker and refused a 3N1.  Expected Discharge Date:                  Expected Discharge Plan:  Home w Home Health Services  In-House Referral:  NA  Discharge planning Services  CM Consult  Post Acute Care Choice:  Home Health Choice offered to:  Patient  DME Arranged:    DME Agency:     HH Arranged:  [facility]  Status of Service:  Completed, signed off  Medicare Important Message Given:    Date Medicare IM Given:    Medicare IM give by:    Date Additional Medicare IM Given:    Additional Medicare Important Message give by:     If discussed at Long Length of Stay Meetings, dates discussed:    Additional Comments:  [name], Andrea Lykins Watson, RN 10/01/2014, 3:10 PM

## 2014-10-01 NOTE — Progress Notes (Signed)
Physical Therapy Treatment Patient Details Name: [name] MRN: [id number] DOB: 28-Dec-1960 Today's Date: 10/01/2014    History of Present Illness Patient is a 54 y/o female s/p L THA, direct anterior approach.     PT Comments    Patient progressing well towards PT goals. Continues to require Min A with bed mobility. Improving ambulation distance. Plan for stair training next session as tolerated to prepare pt for d/c home. Will continue to follow to maximize independence and mobility.   Follow Up Recommendations  Home health PT;Supervision/Assistance - 24 hour     Equipment Recommendations  None recommended by PT    Recommendations for Other Services       Precautions / Restrictions Precautions Precautions: Fall Precaution Comments: direct anterior approach. Restrictions Weight Bearing Restrictions: Yes LLE Weight Bearing: Weight bearing as tolerated    Mobility  Bed Mobility Overal bed mobility: Needs Assistance Bed Mobility: Supine to Sit     Supine to sit: Min assist;HOB elevated     General bed mobility comments: Min A to bring LLE to EOB. Instructed pt to use sheet to get RLE to EOB.  Transfers Overall transfer level: Needs assistance Equipment used: Rolling walker (2 wheeled) Transfers: Sit to/from Stand Sit to Stand: Min guard         General transfer comment: Min guard for safety.   Ambulation/Gait Ambulation/Gait assistance: Min guard Ambulation Distance (Feet): 150 Feet Assistive device: Rolling walker (2 wheeled) Gait Pattern/deviations: Step-through pattern;Decreased stride length;Decreased stance time - left;Decreased step length - right;Antalgic   Gait velocity interpretation: Below normal speed for age/gender General Gait Details: Pt with slow, mildly unsteady gait. Emphasized heel strike and upright posture.    Stairs            Wheelchair Mobility    Modified Rankin (Stroke Patients Only)       Balance Overall balance  assessment: Needs assistance Sitting-balance support: Feet supported;No upper extremity supported Sitting balance-Leahy Scale: Good     Standing balance support: During functional activity Standing balance-Leahy Scale: Fair                      Cognition Arousal/Alertness: Awake/alert Behavior During Therapy: WFL for tasks assessed/performed Overall Cognitive Status: Within Functional Limits for tasks assessed                      Exercises      General Comments        Pertinent Vitals/Pain Pain Assessment: 0-10 Pain Score: 4  Pain Location: left hip Pain Descriptors / Indicators: Sore Pain Intervention(s): Monitored during session;Repositioned    Home Living                      Prior Function            PT Goals (current goals can now be found in the care plan section) Progress towards PT goals: Progressing toward goals    Frequency  7X/week    PT Plan Current plan remains appropriate    Co-evaluation             End of Session Equipment Utilized During Treatment: Gait belt Activity Tolerance: Patient tolerated treatment well Patient left: in chair;with call bell/phone within reach;with family/visitor present     Time: [id number] PT Time Calculation (min) (ACUTE ONLY): 25 min  Charges:  $Gait Training: 23-37 mins  G Codes:      Marcy Panning 10/01/2014, 4:48 PM  Mylo Red, PT, DPT [phone]

## 2014-10-01 NOTE — Plan of Care (Signed)
Problem: Consults Goal: Diagnosis- Total Joint Replacement Outcome: Completed/Met Date Met:  10/01/14 Primary Total Hip Left

## 2014-10-02 LAB — CBC
HCT: 31.4 % — ABNORMAL LOW (ref 36.0–46.0)
Hemoglobin: 10.4 g/dL — ABNORMAL LOW (ref 12.0–15.0)
MCH: 26.7 pg (ref 26.0–34.0)
MCHC: 33.1 g/dL (ref 30.0–36.0)
MCV: 80.7 fL (ref 78.0–100.0)
PLATELETS: 184 10*3/uL (ref 150–400)
RBC: 3.89 MIL/uL (ref 3.87–5.11)
RDW: 14.1 % (ref 11.5–15.5)
WBC: 8.2 10*3/uL (ref 4.0–10.5)

## 2014-10-02 NOTE — Progress Notes (Signed)
Subjective: 2 Days Post-Op Procedure(s) (LRB): LEFT TOTAL HIP ARTHROPLASTY ANTERIOR APPROACH (Left) Patient reports pain as mild.  Wanting to go home today after PT. No complaints.  Objective: Vital signs in last 24 hours: Temp:  [98.2 F (36.8 C)-99.7 F (37.6 C)] 98.8 F (37.1 C) (06/30 0548) Pulse Rate:  [81-91] 81 (06/30 0548) Resp:  [16-18] 18 (06/30 0548) BP: (109-146)/(51-78) 134/78 mmHg (06/30 0548) SpO2:  [95 %-100 %] 97 % (06/30 0548)  Intake/Output from previous day: 06/29 0701 - 06/30 0700 In: 600 [P.O.:600] Out: -  Intake/Output this shift:     Recent Labs  10/01/14 0446 10/02/14 0405  HGB 11.0* 10.4*    Recent Labs  10/01/14 0446 10/02/14 0405  WBC 8.5 8.2  RBC 4.10 3.89  HCT 33.4* 31.4*  PLT 211 184    Recent Labs  10/01/14 0446  NA 140  K 4.2  CL 102  CO2 29  BUN 12  CREATININE 0.90  GLUCOSE 131*  CALCIUM 8.9   No results for input(s): LABPT, INR in the last 72 hours.  Sensation intact distally Intact pulses distally Dorsiflexion/Plantar flexion intact Incision: dressing C/D/I Compartment soft  Assessment/Plan: 2 Days Post-Op Procedure(s) (LRB): LEFT TOTAL HIP ARTHROPLASTY ANTERIOR APPROACH (Left) Up with therapy Discharge home with home health  Richardean CanalCLARK, GILBERT 10/02/2014, 7:52 AM

## 2014-10-02 NOTE — Progress Notes (Signed)
Occupational Therapy Evaluation Patient Details Name: KANA REIMANN MRN: [id number] DOB: 02/09/1961 Today's Date: 10/02/2014    History of Present Illness Patient is a 54 y/o female s/p L THA, direct anterior approach.    Clinical Impression   Patient presenting with decreased strength and decreased independence with ADLs, IADLs, and functional mobility secondary to above. Patient independent PTA. Patient currently requires mod->max assist with LB ADLs without use of AE and supervision->min guard assist with functional mobility/transfers using RW. Patient will benefit from acute OT to increase overall independence in the areas of ADLs, functional mobility, and overall safety in order to safely discharge home with assistance from husband and children.     Follow Up Recommendations  No OT follow up;Supervision/Assistance - 24 hour    Equipment Recommendations  3 in 1 bedside comode;Other (comment) (reacher, LH sponge)    Recommendations for Other Services  None at this time   Precautions / Restrictions Precautions Precautions: Fall Precaution Comments: direct anterior approach. Restrictions Weight Bearing Restrictions: Yes LLE Weight Bearing: Weight bearing as tolerated    Mobility Bed Mobility Overal bed mobility: Needs Assistance Bed Mobility: Supine to Sit     Supine to sit: Supervision     General bed mobility comments: HOB flat, no use of rails to simulate home environment. Instructed pt to use RLE to bring LLE to EOB.   Transfers Overall transfer level: Needs assistance Equipment used: Rolling walker (2 wheeled) Transfers: Sit to/from Stand Sit to Stand: Supervision General transfer comment: Supervision for safety. Sit<>stand from recliner for LB ADLs.     Balance Overall balance assessment: Needs assistance Sitting-balance support: No upper extremity supported;Feet supported Sitting balance-Leahy Scale: Good     Standing balance support: Bilateral upper  extremity supported;During functional activity Standing balance-Leahy Scale: Fair    ADL Overall ADL's : Needs assistance/impaired   General ADL Comments: Pt requires AE for LB ADLs, pt mod->max assist without AE. Pt supervision for functional sit<>stand transfers. Educated patient on use of reacher and LH sponge for LB ADLs. Also encouraged patient not to wear flip flops, instead to wear supportive shoes with closed toes and back.     Pertinent Vitals/Pain Pain Assessment: 0-10 Pain Score: 4  Pain Location: left hip Pain Descriptors / Indicators: Sore Pain Intervention(s): Limited activity within patient's tolerance;Monitored during session;Repositioned     Hand Dominance Right   Extremity/Trunk Assessment Upper Extremity Assessment Upper Extremity Assessment: Overall WFL for tasks assessed   Lower Extremity Assessment Lower Extremity Assessment: Defer to PT evaluation   Cervical / Trunk Assessment Cervical / Trunk Assessment: Normal   Communication Communication Communication: No difficulties   Cognition Arousal/Alertness: Lethargic;Suspect due to medications Behavior During Therapy: [facility] for tasks assessed/performed Overall Cognitive Status: Within Functional Limits for tasks assessed              Home Living Family/patient expects to be discharged to:: Private residence Living Arrangements: Spouse/significant other;Children Available Help at Discharge: Family;Available 24 hours/day (husband taking 1 week off from work) Type of Home: House Home Access: Stairs to enter Secretary/administrator of Steps: 7 Entrance Stairs-Rails: Left Home Layout: Multi-level (split level) Alternate Level Stairs-Number of Steps: 1 flight Alternate Level Stairs-Rails: Left Bathroom Shower/Tub: Tub/shower unit;Curtain   Bathroom Toilet: Handicapped height     Home Equipment: Environmental consultant - 2 wheels   Additional Comments: Pt reports she will borrow a RW.      Prior  Functioning/Environment Level of Independence: Independent     OT Diagnosis: Generalized weakness;Acute  pain   OT Problem List: Decreased strength;Decreased range of motion;Decreased activity tolerance;Impaired balance (sitting and/or standing);Decreased safety awareness;Decreased knowledge of use of DME or AE;Pain   OT Treatment/Interventions: Self-care/ADL training;Therapeutic exercise;Energy conservation;DME and/or AE instruction;Therapeutic activities;Patient/family education;Balance training    OT Goals(Current goals can be found in the care plan section) Acute Rehab OT Goals Patient Stated Goal: go home today! OT Goal Formulation: With patient Time For Goal Achievement: 10/09/14 Potential to Achieve Goals: Good ADL Goals Pt Will Perform Grooming: with modified independence;standing Pt Will Perform Lower Body Bathing: with modified independence;sit to/from stand;with adaptive equipment Pt Will Perform Lower Body Dressing: with modified independence;sit to/from stand;with adaptive equipment Pt Will Transfer to Toilet: with modified independence;ambulating;bedside commode Pt Will Perform Tub/Shower Transfer: Tub transfer;Shower transfer;ambulating;3 in 1;rolling walker;with modified independence  OT Frequency: Min 2X/week   Barriers to D/C: None known at this time   End of Session Equipment Utilized During Treatment: Rolling walker  Activity Tolerance: Patient tolerated treatment well Patient left: in chair;with call bell/phone within reach;with nursing/sitter in room   Time: 1035-1059 OT Time Calculation (min): 24 min Charges:  OT General Charges $OT Visit: 1 Procedure OT Evaluation $Initial OT Evaluation Tier I: 1 Procedure OT Treatments $Self Care/Home Management : 8-22 mins  Mineola Duan , MS, OTR/L, CLT Pager: [phone]  10/02/2014, 11:07 AM

## 2014-10-02 NOTE — Progress Notes (Signed)
Physical Therapy Treatment Patient Details Name: [name] MRN: [id number] DOB: 10/13/1960 Today's Date: 10/02/2014    History of Present Illness Patient is a 54 y/o female s/p L THA, direct anterior approach.     PT Comments    Patient progressing well towards PT goals. Performed stair training this session. Pt requiring Min A for balance/safety. Instructed pt on how to get into/out of car. Reviewed exercises. Cues to keep using RW until strength/balance improve. Pt safe to discharge from a mobility standpoint. Will continue to follow if still in hospital to maximize independence and safe mobility.    Follow Up Recommendations  Home health PT;Supervision/Assistance - 24 hour     Equipment Recommendations  None recommended by PT    Recommendations for Other Services       Precautions / Restrictions Precautions Precautions: Fall Precaution Comments: direct anterior approach. Restrictions Weight Bearing Restrictions: Yes LLE Weight Bearing: Weight bearing as tolerated    Mobility  Bed Mobility Overal bed mobility: Needs Assistance Bed Mobility: Supine to Sit     Supine to sit: Supervision     General bed mobility comments: HOB flat, no use of rails to simulate home environment. Instructed pt to use RLE to bring LLE to EOB.   Transfers Overall transfer level: Needs assistance Equipment used: Rolling walker (2 wheeled) Transfers: Sit to/from Stand Sit to Stand: Min guard         General transfer comment: Min guard for safety. Cues for controlled descent into chair. Stood from AllstateEOB x1, from toilet x1, from chair x1.   Ambulation/Gait Ambulation/Gait assistance: Supervision Ambulation Distance (Feet): 150 Feet Assistive device: Rolling walker (2 wheeled) Gait Pattern/deviations: Step-through pattern;Decreased stride length;Antalgic;Decreased step length - right;Decreased stance time - left   Gait velocity interpretation: Below normal speed for  age/gender General Gait Details: Cues for heel strike LLE and upright posture. Multiple short standing rest breaks.    Stairs Stairs: Yes Stairs assistance: Min assist Stair Management: Step to pattern;One rail Right Number of Stairs: 7 General stair comments: Cues for technique and Min A for descent.  Wheelchair Mobility    Modified Rankin (Stroke Patients Only)       Balance Overall balance assessment: Needs assistance Sitting-balance support: Feet supported;No upper extremity supported Sitting balance-Leahy Scale: Good     Standing balance support: During functional activity Standing balance-Leahy Scale: Fair                      Cognition Arousal/Alertness: Awake/alert Behavior During Therapy: WFL for tasks assessed/performed Overall Cognitive Status: Within Functional Limits for tasks assessed                      Exercises Total Joint Exercises Ankle Circles/Pumps: Both;10 reps;Supine Long Arc Quad: Left;10 reps;Seated Other Exercises Other Exercises: hip flexion AAROM x5.    General Comments        Pertinent Vitals/Pain Pain Assessment: 0-10 Pain Score: 2  Pain Location: left hip Pain Descriptors / Indicators: Sore Pain Intervention(s): Monitored during session;Repositioned;Premedicated before session    Home Living Family/patient expects to be discharged to:: Private residence Living Arrangements: Spouse/significant other;Children                  Prior Function            PT Goals (current goals can now be found in the care plan section) Progress towards PT goals: Progressing toward goals    Frequency  7X/week  PT Plan Current plan remains appropriate    Co-evaluation             End of Session Equipment Utilized During Treatment: Gait belt Activity Tolerance: Patient tolerated treatment well Patient left: in chair;with call bell/phone within reach     Time: 0918-0948 PT Time Calculation (min) (ACUTE  ONLY): 30 min  Charges:  $Gait Training: 8-22 mins $Therapeutic Activity: 8-22 mins                    G Codes:      Maiko Salais A Nijel Flink 10/02/2014, 10:21 AM Mylo Red, PT, DPT [phone]

## 2014-10-02 NOTE — Discharge Summary (Signed)
Patient ID: [name] MRN: [id number] DOB/AGE: 07-24-1960 54 y.o.  Admit date: 09/30/2014 Discharge date: 10/02/2014  Admission Diagnoses:  Principal Problem:   Osteoarthritis of left hip Active Problems:   Status post total replacement of left hip   Discharge Diagnoses:  Same  Past Medical History  Diagnosis Date  . No pertinent past medical history   . PONV (postoperative nausea and vomiting)     pt believes due to morphine    Surgeries: Procedure(s): LEFT TOTAL HIP ARTHROPLASTY ANTERIOR APPROACH on 09/30/2014   Consultants:  PT/OT  Discharged Condition: Improved  Hospital Course: [name] is an 54 y.o. female who was admitted 09/30/2014 for operative treatment ofOsteoarthritis of left hip. Patient has severe unremitting pain that affects sleep, daily activities, and work/hobbies. After pre-op clearance the patient was taken to the operating room on 09/30/2014 and underwent  Procedure(s): LEFT TOTAL HIP ARTHROPLASTY ANTERIOR APPROACH.    Patient was given perioperative antibiotics: Anti-infectives    Start     Dose/Rate Route Frequency Ordered Stop   09/30/14 2200  ceFAZolin (ANCEF) IVPB 1 g/50 mL premix     1 g 100 mL/hr over 30 Minutes Intravenous Every 6 hours 09/30/14 2108 10/01/14 0517   09/30/14 1400  ceFAZolin (ANCEF) IVPB 2 g/50 mL premix     2 g 100 mL/hr over 30 Minutes Intravenous To ShortStay [facility] 09/29/14 1345 09/30/14 1630       Patient was given sequential compression devices, early ambulation, and chemoprophylaxis to prevent DVT.  Patient benefited maximally from hospital stay and there were no complications.    Recent vital signs: Patient Vitals for the past 24 hrs:  BP Temp Temp src Pulse Resp SpO2  10/02/14 0548 134/78 mmHg 98.8 F (37.1 C) Oral 81 18 97 %  10/01/14 1957 (!) 146/71 mmHg 99.2 F (37.3 C) - 91 17 99 %  10/01/14 1828 (!) 127/54 mmHg 99.7 F (37.6 C) Oral 81 18 95 %  10/01/14 1437 (!) 109/51 mmHg 98.2 F (36.8 C)  Oral 81 16 100 %     Recent laboratory studies:  Recent Labs  10/01/14 0446 10/02/14 0405  WBC 8.5 8.2  HGB 11.0* 10.4*  HCT 33.4* 31.4*  PLT 211 184  NA 140  --   K 4.2  --   CL 102  --   CO2 29  --   BUN 12  --   CREATININE 0.90  --   GLUCOSE 131*  --   CALCIUM 8.9  --      Discharge Medications:     Medication List    TAKE these medications        ALEVE 220 MG tablet  Generic drug:  naproxen sodium  Take 220 mg by mouth 2 (two) times daily as needed (Pain).     aspirin 325 MG EC tablet  Take 1 tablet (325 mg total) by mouth 2 (two) times daily after a meal.     b complex vitamins tablet  Take 1 tablet by mouth daily.     docusate sodium 100 MG capsule  Commonly known as:  COLACE  Take 1 capsule (100 mg total) by mouth 2 (two) times daily as needed for mild constipation.     ibuprofen 200 MG tablet  Commonly known as:  ADVIL,MOTRIN  Take 400 mg by mouth every 8 (eight) hours as needed for moderate pain.     KELP PO  Take 1 tablet by mouth daily.     methocarbamol  500 MG tablet  Commonly known as:  ROBAXIN  Take 1 tablet (500 mg total) by mouth every 6 (six) hours as needed for muscle spasms.     multivitamin with minerals Tabs tablet  Take 1 tablet by mouth daily.     oxyCODONE-acetaminophen 5-325 MG per tablet  Commonly known as:  ROXICET  Take 1-2 tablets by mouth every 4 (four) hours as needed.     promethazine 12.5 MG tablet  Commonly known as:  PHENERGAN  Take 1 tablet (12.5 mg total) by mouth every 8 (eight) hours as needed for nausea or vomiting.     Selenium 100 MCG Caps  Take 2 capsules by mouth 2 (two) times daily.        Diagnostic Studies: Dg Hip Port Unilat With Pelvis 1v Left  09/30/2014   CLINICAL DATA:  Osteoarthritis of the left hip.  EXAM: LEFT HIP (WITH PELVIS) 1 VIEW PORTABLE  COMPARISON:  None.  FINDINGS: AP view of the left hip demonstrates that the acetabular and femoral components of the total hip prosthesis appear in  excellent position. No fractures. The pelvic bones appear normal.  IMPRESSION: Satisfactory appearance of the left hip in the AP projection after total hip prosthesis insertion.   Electronically Signed   By: Francene Boyers M.D.   On: 09/30/2014 18:21   Dg Hip Operative Unilat With Pelvis Left  09/30/2014   CLINICAL DATA:  Osteoarthritis the left hip.  EXAM: OPERATIVE LEFT HIP (WITH PELVIS IF PERFORMED)  VIEWS  TECHNIQUE: Fluoroscopic spot image(s) were submitted for interpretation post-operatively.  FLUOROSCOPY TIME:  Fluoroscopy Time:  0 MINUTES 45 SECONDS  COMPARISON:  None.  FINDINGS: AP C-arm images demonstrate the patient has undergone left total hip prosthesis insertion. No visible fractures. The prosthesis appears in excellent position in the AP projection.  IMPRESSION: Insertion of left total hip prosthesis.   Electronically Signed   By: Francene Boyers M.D.   On: 09/30/2014 18:20    Disposition: Home with Home health       Follow-up Information    Follow up with [facility].   Why:  They will contact you to schedule home therapy visits.   Contact information:   [address] [place] [address] [phone]       Follow up with Kathryne Hitch, MD In 2 weeks.   Specialty:  Orthopedic Surgery   Contact information:   [address] [place] [address] [phone]        Signed: Richardean Canal 10/02/2014, 7:54 AM

## 2017-04-27 ENCOUNTER — Encounter: Payer: Self-pay | Admitting: Physician Assistant
